# Patient Record
Sex: Female | Born: 1965 | Race: White | Hispanic: No | Marital: Married | Smoking: Never smoker
Health system: Southern US, Community
[De-identification: ages and names within clinical notes are randomized; demographics above are authoritative.]

## PROBLEM LIST (undated history)

## (undated) DIAGNOSIS — S52123A Displaced fracture of head of unspecified radius, initial encounter for closed fracture: Secondary | ICD-10-CM

---

## 2008-12-28 ENCOUNTER — Emergency Department (HOSPITAL_COMMUNITY): Admission: EM | Admit: 2008-12-28 | Discharge: 2008-12-28 | Payer: Self-pay | Admitting: Emergency Medicine

## 2012-07-08 ENCOUNTER — Encounter (HOSPITAL_COMMUNITY): Payer: Self-pay | Admitting: Nurse Practitioner

## 2012-07-08 ENCOUNTER — Emergency Department (HOSPITAL_COMMUNITY)
Admission: EM | Admit: 2012-07-08 | Discharge: 2012-07-08 | Disposition: A | Discharge status: Home / Self Care | Payer: BC Managed Care – PPO | Attending: Emergency Medicine | Admitting: Emergency Medicine

## 2012-07-08 DIAGNOSIS — S61219A Laceration without foreign body of unspecified finger without damage to nail, initial encounter: Secondary | ICD-10-CM

## 2012-07-08 DIAGNOSIS — W278XXA Contact with other nonpowered hand tool, initial encounter: Secondary | ICD-10-CM | POA: Insufficient documentation

## 2012-07-08 DIAGNOSIS — S61209A Unspecified open wound of unspecified finger without damage to nail, initial encounter: Secondary | ICD-10-CM | POA: Insufficient documentation

## 2012-07-08 DIAGNOSIS — Y939 Activity, unspecified: Secondary | ICD-10-CM | POA: Insufficient documentation

## 2012-07-08 DIAGNOSIS — Y929 Unspecified place or not applicable: Secondary | ICD-10-CM | POA: Insufficient documentation

## 2012-07-08 MED ORDER — CEPHALEXIN 500 MG PO CAPS: 500.0000 mg | ORAL_CAPSULE | Freq: Four times a day (QID) | ORAL | Status: DC

## 2012-07-08 MED ORDER — OXYCODONE-ACETAMINOPHEN 5-325 MG PO TABS: 1.0000 | ORAL_TABLET | ORAL | Status: DC | PRN

## 2012-07-08 NOTE — ED Notes (Signed)
Pt reports she was cutting a sweater and cut into her L index finger. Pt reports at the time blood "Squirted across the room" but there is no active bleeding now and clean dressing is in place. Reports minimal pain .

## 2012-07-08 NOTE — ED Provider Notes (Signed)
History   This chart was scribed for Lori Razor, MD by Sofie Rower, ED Scribe. The patient was seen in room TR07C/TR07C and the patient's care was started at 3:45PM.     CSN: [id number]  Arrival date & time 07/08/12  1359   First MD Initiated Contact with Patient 07/08/12 1545      Chief Complaint  Patient presents with  . Laceration    (Consider location/radiation/quality/duration/timing/severity/associated sxs/prior treatment) Patient is a 47 y.o. female presenting with skin laceration. The history is provided by the patient. No language interpreter was used.  Laceration  The incident occurred 1 to 2 hours ago. The laceration is located on the left hand. Size: 1.5 cm. The laceration mechanism was a a metal edge. The pain is moderate. The pain has been constant since onset. She reports no foreign bodies present. Her tetanus status is UTD.    Lori Flores is a 48 y.o. female , who presents to the Emergency Department complaining of sudden, moderate, laceration located at the left 2nd finger, onset today (07/09/11). The pt reports she was cutting a sweatshirt for her dog, where her hands suddenly slipped, cutting her left 2nd finger with a pair of scissors. The pt has applied a bandage dressing which provides moderate relief of pain and bleeding associated with the laceration. Modifying factors include certain movements and positions of the left 2nd finger which intensifies the pain associated with the laceration. The pt has not taken medications for pain PTA.  The pt denies numbness and tingling. The pt's last tetanus immunization was three years ago.   The pt does not smoke or drink alcohol.     History reviewed. No pertinent past medical history.  History reviewed. No pertinent past surgical history.  History reviewed. No pertinent family history.  History  Substance Use Topics  . Smoking status: Never Smoker   . Smokeless tobacco: Not on file  . Alcohol Use: No    OB  History    Grav Para Term Preterm Abortions TAB SAB Ect Mult Living                  Review of Systems  Skin: Positive for wound.  All other systems reviewed and are negative.    Allergies  Review of patient's allergies indicates no known allergies.  Home Medications  No current outpatient prescriptions on file.  BP 118/81  Pulse 105  Temp 98.2 F (36.8 C) (Oral)  Resp 16  SpO2 98%  Physical Exam  Nursing note and vitals reviewed. Constitutional: She appears well-developed and well-nourished. No distress.  HENT:  Head: Normocephalic and atraumatic.  Eyes: Conjunctivae normal are normal. Right eye exhibits no discharge. Left eye exhibits no discharge.  Neck: Neck supple.  Cardiovascular: Normal rate, regular rhythm and normal heart sounds.  Exam reveals no gallop and no friction rub.   No murmur heard. Pulmonary/Chest: Effort normal and breath sounds normal. No respiratory distress.  Abdominal: Soft. She exhibits no distension. There is no tenderness.  Musculoskeletal: She exhibits no edema and no tenderness.       1.5 cm laceration located at the flexor crease of the DIP of the left index finger. Mild oozing. Flexor tendon not visualized. Patient able to flex of the DIP against resistance. Decrease sensation at the ulnar aspect of the fingertip. Cap refill less than 2 seconds at fingertip.  Neurological: She is alert.  Skin: Skin is warm and dry.  Psychiatric: She has a normal mood and affect.  Her behavior is normal. Thought content normal.    ED Course  NERVE BLOCK Date/Time: 07/08/2012 5:17 PM Performed by: Lori Flores Authorized by: Lori Flores Consent: Verbal consent obtained. Risks and benefits: risks, benefits and alternatives were discussed Consent given by: patient Patient identity confirmed: verbally with patient Indications: pain relief Body area: upper extremity Nerve: digital Laterality: left Patient sedated: no Preparation: Patient was prepped  and draped in the usual sterile fashion. Patient position: sitting Needle gauge: 27 G Location technique: anatomical landmarks Local anesthetic: lidocaine 2% with epinephrine Anesthetic total: 2 ml Outcome: pain improved Patient tolerance: Patient tolerated the procedure well with no immediate complications.   (including critical care time)  LACERATION REPAIR Performed by: Lori Flores Authorized by: Lori Flores Consent: Verbal consent obtained. Risks and benefits: risks, benefits and alternatives were discussed Consent given by: patient Patient identity confirmed: provided demographic data Prepped and Draped in normal sterile fashion Wound explored  Laceration Location: Left index finger  Laceration Length: 1.5 cm  No Foreign Bodies seen or palpated  Anesthesia: local infiltration  Local anesthetic: lidocaine 2% w/ epinephrine  Anesthetic total: 2 ml  Irrigation method: syringe Amount of cleaning: standard  Skin closure: Single layer   Number of sutures: 3  Technique: Simple interrupted  Patient tolerance: Patient tolerated the procedure well with no immediate complications.   DIAGNOSTIC STUDIES: Oxygen Saturation is 98% on room air, normal by my interpretation.    COORDINATION OF CARE:  3:56 PM- Treatment plan discussed with patient. Pt agrees with treatment.  4:21 PM- Digital block performed.Treatment plan discussed with patient. Pt agrees with treatment.  5:09 PM- Recheck. Treatment plan concerning follow up with hand specialist on Monday, 07/10/12, discussed with patient. Pt agrees with treatment.          Labs Reviewed - No data to display No results found.   1. Finger laceration       MDM  46y female w/ finger lac. Decreased sensation concern for possible digital nerve injury. Laceration was closed. Case was discussed with on-call hand surgery, Dr. Amanda Pea.  He would like to see patient in followup on Monday. Requesting Keflex. Pain  medicine prescription provided. Emergent return precautions and continued wound care discussed.     I personally preformed the services scribed in my presence. The recorded information has been reviewed is accurate. Lori Razor, MD.    Lori Razor, MD 07/11/12 [phone]

## 2012-07-08 NOTE — ED Notes (Addendum)
Dr. Juleen China numbed pt. Laceration with xylocaine 2% and sutured. Pt tolerated well.

## 2016-06-28 ENCOUNTER — Encounter (HOSPITAL_COMMUNITY): Payer: Self-pay

## 2016-06-28 ENCOUNTER — Emergency Department (HOSPITAL_COMMUNITY)
Admission: EM | Admit: 2016-06-28 | Discharge: 2016-06-28 | Disposition: A | Discharge status: Home / Self Care | Payer: BLUE CROSS/BLUE SHIELD | Attending: Physician Assistant | Admitting: Physician Assistant

## 2016-06-28 ENCOUNTER — Emergency Department (HOSPITAL_COMMUNITY): Payer: BLUE CROSS/BLUE SHIELD

## 2016-06-28 DIAGNOSIS — S52125A Nondisplaced fracture of head of left radius, initial encounter for closed fracture: Secondary | ICD-10-CM | POA: Diagnosis not present

## 2016-06-28 DIAGNOSIS — Y9351 Activity, roller skating (inline) and skateboarding: Secondary | ICD-10-CM | POA: Insufficient documentation

## 2016-06-28 DIAGNOSIS — Y999 Unspecified external cause status: Secondary | ICD-10-CM | POA: Insufficient documentation

## 2016-06-28 DIAGNOSIS — S59902A Unspecified injury of left elbow, initial encounter: Secondary | ICD-10-CM | POA: Diagnosis present

## 2016-06-28 DIAGNOSIS — Y92480 Sidewalk as the place of occurrence of the external cause: Secondary | ICD-10-CM | POA: Diagnosis not present

## 2016-06-28 MED ORDER — TRAMADOL HCL 50 MG PO TABS: 50.0000 mg | ORAL_TABLET | Freq: Four times a day (QID) | ORAL | 0 refills | Status: DC | PRN

## 2016-06-28 MED ORDER — TRAMADOL HCL 50 MG PO TABS
50.0000 mg | ORAL_TABLET | Freq: Once | ORAL | Status: DC
  Filled 2016-06-28: qty 1

## 2016-06-28 NOTE — ED Notes (Signed)
Patient taken to xray.

## 2016-06-28 NOTE — ED Provider Notes (Signed)
[facility]-EMERGENCY DEPT Provider Note   CSN: [id number] Arrival date & time: 06/28/16  1759  By signing my name below, I, Clarisse GougeXavier Herndon, attest that this documentation has been prepared under the direction and in the presence of [facility] Toniqua Melamed, [place]. Electronically Signed: Clarisse GougeXavier Herndon, Scribe. 06/28/16. 7:12 PM.    History   Chief Complaint Chief Complaint  Patient presents with  . Arm Pain   The history is provided by the patient. No language interpreter was used.    HPI Comments: Lori Flores is a 50 y.o. female who presents to the Emergency Department complaining of left elbow pain after sustaining an injury 1 hour ago. She states she was on a "one wheel" skateboard in her neighborhood when she ran into the sidewalk. The pt states she then fell from the skateboard, and she hit her elbow on the pavement. She notes pain exacerbated when extending, rotating and lowering the left forearm. She reveals she can extend the forearm slightly if she raises the arm. She discloses she has taken nothing for pain at home. patient denies head injury or LOC.    History reviewed. No pertinent past medical history.  There are no active problems to display for this patient.   History reviewed. No pertinent surgical history.  OB History    No data available       Home Medications    Prior to Admission medications   Medication Sig Start Date End Date Taking? Authorizing Provider  amphetamine-dextroamphetamine (ADDERALL) 30 MG tablet Take 30 mg by mouth daily.    Historical Provider, MD  cephALEXin (KEFLEX) 500 MG capsule Take 1 capsule (500 mg total) by mouth 4 (four) times daily. 07/08/12   Raeford RazorStephen Kohut, MD  oxyCODONE-acetaminophen (PERCOCET/ROXICET) 5-325 MG per tablet Take 1-2 tablets by mouth every 4 (four) hours as needed for pain. 07/08/12   Raeford RazorStephen Kohut, MD  traMADol (ULTRAM) 50 MG tablet Take 1 tablet (50 mg total) by mouth every 6 (six) hours as needed. 06/28/16   Jamus Loving Orlene OchM Kylin Dubs, NP     Family History History reviewed. No pertinent family history.  Social History Social History  Substance Use Topics  . Smoking status: Never Smoker  . Smokeless tobacco: Never Used  . Alcohol use No     Allergies   Patient has no known allergies.   Review of Systems Review of Systems  Gastrointestinal: Negative for nausea and vomiting.  Musculoskeletal: Positive for arthralgias.       Left elbow pain  Skin: Negative for wound.       intact  Neurological: Negative for syncope and headaches.  All other systems reviewed and are negative.    Physical Exam Updated Vital Signs BP 114/67   Pulse 98   Temp 98.2 F (36.8 C) (Oral)   Resp 14   LMP 06/28/2016   SpO2 98%   Physical Exam  Constitutional: She is oriented to person, place, and time. She appears well-developed and well-nourished. No distress.  HENT:  Head: Normocephalic and atraumatic.  Right Ear: Hearing normal.  Left Ear: Hearing normal.  Mouth/Throat: Mucous membranes are normal.  Eyes: EOM are normal.  Neck: Normal range of motion. Neck supple.  Cardiovascular: Normal rate, S1 normal and S2 normal.   Pulmonary/Chest: Effort normal. No respiratory distress.  Abdominal: Normal appearance. There is no hepatosplenomegaly. There is no tenderness at McBurney's point and negative Murphy's sign.  Musculoskeletal: She exhibits tenderness. She exhibits no deformity.       Left elbow: She exhibits  swelling. She exhibits no deformity and no laceration. Decreased range of motion: due to pain. Tenderness found. Radial head tenderness noted.  Radial pulse 2+, adequate circulation.   Neurological: She is alert and oriented to person, place, and time. She has normal strength.  Skin: Skin is warm, dry and intact. No cyanosis.  intact  Psychiatric: She has a normal mood and affect. Her speech is normal and behavior is normal. Thought content normal.  Nursing note and vitals reviewed.    ED Treatments / Results   DIAGNOSTIC STUDIES: Oxygen Saturation is 98% on RA, normal by my interpretation.    COORDINATION OF CARE: 7:12 PM Discussed treatment plan with pt at bedside and pt agreed to plan.  Labs (all labs ordered are listed, but only abnormal results are displayed) Labs Reviewed - No data to display   Radiology Dg Elbow Complete Left  Result Date: 06/28/2016 CLINICAL DATA:  Fall from skateboard, left elbow pain EXAM: LEFT ELBOW - COMPLETE 3+ VIEW COMPARISON:  None. FINDINGS: Possible nondisplaced fracture of the radial head/neck. The joint spaces are preserved. Displaced elbow joint fat pads, suggesting elbow joint effusion. IMPRESSION: Possible nondisplaced fracture of the radial head/ neck. Correlate for point tenderness. Suspected elbow joint effusion. Electronically Signed   By: Charline BillsSriyesh  Krishnan M.D.   On: 06/28/2016 18:38   Dg Forearm Left  Result Date: 06/28/2016 CLINICAL DATA:  Fall from skateboard, left arm pain EXAM: LEFT FOREARM - 2 VIEW COMPARISON:  None. FINDINGS: Proximal forearm/elbow is evaluated separately. Distal radius/ulna are intact. The joint spaces are preserved. Visualized soft tissues are within normal limits. IMPRESSION: No fracture or dislocation of the distal radius/ulna. Electronically Signed   By: Charline BillsSriyesh  Krishnan M.D.   On: 06/28/2016 18:39    Procedures Procedures (including critical care time)  Medications Ordered in ED Medications  traMADol (ULTRAM) tablet 50 mg (not administered)     Initial Impression / Assessment and Plan / ED Course  I have reviewed the triage vital signs and the nursing notes.  Pertinent imaging results that were available during my care of the patient were reviewed by me and considered in my medical decision making (see chart for details).  Clinical Course   50 y.o. female with left elbow pain s/p fall stable for d/c without focal neuro deficits. X-ray positive for radial head fracture. Discussed with the patient x-ray results,  pain managed in the ED, arm sling applied, ice and f/u with ortho. Patient agrees with plan. Return precautions given.   Final Clinical Impressions(s) / ED Diagnoses   Final diagnoses:  Closed nondisplaced fracture of head of left radius, initial encounter    New Prescriptions New Prescriptions   TRAMADOL (ULTRAM) 50 MG TABLET    Take 1 tablet (50 mg total) by mouth every 6 (six) hours as needed.  *I personally performed the services described in this documentation, which was scribed in my presence. The recorded information has been reviewed and is accurate.    [place]port[place] M Kwali Wrinkle, NP 06/28/16 1921    Courteney Randall AnLyn Mackuen, MD 06/29/16 [phone]

## 2016-06-28 NOTE — ED Triage Notes (Signed)
Pt complaining of L arm pain. Pt states fell off of skateboard. Pt states fell onto left arm. Pt complaining of L posterior forearm pain at the elbow. Pt with full ROM, some decreased ability to extend arm. Pt denies any head injury/trauma. Pt denies any LOC.

## 2016-06-28 NOTE — Discharge Instructions (Signed)
Do not drive while taking the narcotic. Call tomorrow to schedule follow up. Return here as needed for any problems.

## 2016-08-28 ENCOUNTER — Emergency Department (HOSPITAL_COMMUNITY): Payer: BLUE CROSS/BLUE SHIELD

## 2016-08-28 ENCOUNTER — Encounter (HOSPITAL_COMMUNITY): Payer: Self-pay | Admitting: Emergency Medicine

## 2016-08-28 ENCOUNTER — Inpatient Hospital Stay (HOSPITAL_COMMUNITY)
Admission: EM | Admit: 2016-08-28 | Discharge: 2016-09-01 | DRG: 470 | Disposition: A | Discharge status: Home Health | Payer: BLUE CROSS/BLUE SHIELD | Attending: Internal Medicine | Admitting: Internal Medicine

## 2016-08-28 DIAGNOSIS — Y9351 Activity, roller skating (inline) and skateboarding: Secondary | ICD-10-CM

## 2016-08-28 DIAGNOSIS — S72009A Fracture of unspecified part of neck of unspecified femur, initial encounter for closed fracture: Secondary | ICD-10-CM | POA: Diagnosis present

## 2016-08-28 DIAGNOSIS — S72002A Fracture of unspecified part of neck of left femur, initial encounter for closed fracture: Principal | ICD-10-CM | POA: Diagnosis present

## 2016-08-28 DIAGNOSIS — D72829 Elevated white blood cell count, unspecified: Secondary | ICD-10-CM | POA: Diagnosis present

## 2016-08-28 DIAGNOSIS — Z419 Encounter for procedure for purposes other than remedying health state, unspecified: Secondary | ICD-10-CM

## 2016-08-28 DIAGNOSIS — E876 Hypokalemia: Secondary | ICD-10-CM | POA: Diagnosis present

## 2016-08-28 DIAGNOSIS — W19XXXA Unspecified fall, initial encounter: Secondary | ICD-10-CM

## 2016-08-28 DIAGNOSIS — Z79899 Other long term (current) drug therapy: Secondary | ICD-10-CM

## 2016-08-28 DIAGNOSIS — D62 Acute posthemorrhagic anemia: Secondary | ICD-10-CM | POA: Diagnosis not present

## 2016-08-28 DIAGNOSIS — M25552 Pain in left hip: Secondary | ICD-10-CM | POA: Diagnosis not present

## 2016-08-28 DIAGNOSIS — E559 Vitamin D deficiency, unspecified: Secondary | ICD-10-CM | POA: Diagnosis present

## 2016-08-28 HISTORY — DX: Displaced fracture of head of unspecified radius, initial encounter for closed fracture: S52.123A

## 2016-08-28 LAB — BASIC METABOLIC PANEL
Anion gap: 5 (ref 5–15)
BUN: 13 mg/dL (ref 6–20)
CHLORIDE: 111 mmol/L (ref 101–111)
CO2: 23 mmol/L (ref 22–32)
CREATININE: 0.58 mg/dL (ref 0.44–1.00)
Calcium: 8.8 mg/dL — ABNORMAL LOW (ref 8.9–10.3)
GFR calc non Af Amer: >60 mL/min (ref >60)
Glucose, Bld: 107 mg/dL — ABNORMAL HIGH (ref 65–99)
POTASSIUM: 3.9 mmol/L (ref 3.5–5.1)
SODIUM: 139 mmol/L (ref 135–145)

## 2016-08-28 LAB — PROTIME-INR
INR: 0.94
PROTHROMBIN TIME: 12.6 s (ref 11.4–15.2)

## 2016-08-28 LAB — CBC
HCT: 39.5 % (ref 36.0–46.0)
HEMOGLOBIN: 13.3 g/dL (ref 12.0–15.0)
MCH: 29 pg (ref 26.0–34.0)
MCHC: 33.7 g/dL (ref 30.0–36.0)
MCV: 86.2 fL (ref 78.0–100.0)
Platelets: 330 10*3/uL (ref 150–400)
RBC: 4.58 MIL/uL (ref 3.87–5.11)
RDW: 13 % (ref 11.5–15.5)
WBC: 13.4 10*3/uL — AB (ref 4.0–10.5)

## 2016-08-28 LAB — TYPE AND SCREEN
ABO/RH(D): O POS
ANTIBODY SCREEN: NEGATIVE

## 2016-08-28 MED ORDER — HYDROMORPHONE HCL 1 MG/ML IJ SOLN
1.0000 mg | Freq: Once | INTRAMUSCULAR | Status: AC
  Administered 2016-08-28: 1 mg via INTRAVENOUS
  Filled 2016-08-28: qty 1

## 2016-08-28 MED ORDER — MIDAZOLAM HCL 2 MG/2ML IJ SOLN
1.0000 mg | Freq: Once | INTRAMUSCULAR | Status: AC
  Administered 2016-08-28: 1 mg via INTRAVENOUS
  Filled 2016-08-28: qty 2

## 2016-08-28 MED ORDER — ONDANSETRON HCL 4 MG/2ML IJ SOLN: 4.0000 mg | Freq: Four times a day (QID) | INTRAMUSCULAR | Status: DC | PRN

## 2016-08-28 MED ORDER — MORPHINE SULFATE (PF) 4 MG/ML IV SOLN
4.0000 mg | Freq: Once | INTRAVENOUS | Status: AC
  Administered 2016-08-28: 4 mg via INTRAVENOUS
  Filled 2016-08-28: qty 1

## 2016-08-28 NOTE — ED Provider Notes (Signed)
[facility]-EMERGENCY DEPT Provider Note   CSN: [id number] Arrival date & time: 08/28/16  1948     History   Chief Complaint Chief Complaint  Patient presents with  . Fall    HPI Lori Flores is a 51 y.o. female.  HPI Pt presents with severe left hip pain after a full today from a motorized skateboard. Pain with ROM. No head or neck pain. No head injury. Denies weakness in arms or legs. No other complaints at this time.  Reports severe pain with range of motion of her left hip and does not want her left hip moved at this time.  No other complaints.   Past Medical History:  Diagnosis Date  . Radial head fracture     There are no active problems to display for this patient.   History reviewed. No pertinent surgical history.  OB History    No data available       Home Medications    Prior to Admission medications   Medication Sig Start Date End Date Taking? Authorizing Provider  amphetamine-dextroamphetamine (ADDERALL) 30 MG tablet Take 15-30 mg by mouth 2 (two) times daily. Take 30mg  by mouth at 12 noon and take 15 mg by mouth at 4pm.   Yes Historical Provider, MD    Family History No family history on file.  Social History Social History  Substance Use Topics  . Smoking status: Never Smoker  . Smokeless tobacco: Never Used  . Alcohol use No     Allergies   Patient has no known allergies.   Review of Systems Review of Systems  All other systems reviewed and are negative.    Physical Exam Updated Vital Signs BP 125/79   Pulse 100   Temp 98.1 F (36.7 C) (Oral)   Resp 20   Ht 5\' 5"  (1.651 m)   Wt 150 lb (68 kg)   LMP 08/21/2016   SpO2 100%   BMI 24.96 kg/m   Physical Exam  Constitutional: She is oriented to person, place, and time. She appears well-developed and well-nourished. No distress.  HENT:  Head: Normocephalic and atraumatic.  Eyes: EOM are normal.  Neck: Normal range of motion.  Cardiovascular: Normal rate, regular rhythm and  normal heart sounds.   Pulmonary/Chest: Effort normal and breath sounds normal.  Abdominal: Soft. She exhibits no distension. There is no tenderness.  Musculoskeletal:  Severe pain with range of motion of left hip.  No obvious deformity.  Normal pulses in left foot.  Neurological: She is alert and oriented to person, place, and time.  Skin: Skin is warm and dry.  Psychiatric: She has a normal mood and affect. Judgment normal.  Nursing note and vitals reviewed.    ED Treatments / Results  Labs (all labs ordered are listed, but only abnormal results are displayed) Labs Reviewed  CBC - Abnormal; Notable for the following:       Result Value   WBC 13.4 (*)    All other components within normal limits  BASIC METABOLIC PANEL - Abnormal; Notable for the following:    Glucose, Bld 107 (*)    Calcium 8.8 (*)    All other components within normal limits  PROTIME-INR  TYPE AND SCREEN  ABO/RH    EKG  EKG Interpretation None       Radiology Dg Chest Portable 1 View  Result Date: 08/28/2016 CLINICAL DATA:  Fall off skateboard EXAM: PORTABLE CHEST 1 VIEW COMPARISON:  None. FINDINGS: The heart size and mediastinal contours  are within normal limits. Both lungs are clear. The visualized skeletal structures are unremarkable. IMPRESSION: No active disease. Electronically Signed   By: Jasmine PangKim  Fujinaga M.D.   On: 08/28/2016 21:30   Dg Hip Unilat With Pelvis 2-3 Views Left  Result Date: 08/28/2016 CLINICAL DATA:  Fall off a skateboard EXAM: DG HIP (WITH OR WITHOUT PELVIS) 2-3V LEFT COMPARISON:  None. FINDINGS: The SI joints are symmetric bilaterally. Pubic symphysis and rami appear intact. The right femoral head projects in joint. The left femoral head projects in joint. There is step-off deformity at the left femoral head neck junction. IMPRESSION: 1. Mild step-off deformity at the left femoral head neck junction suspicious for a fracture. Suggest CT for further evaluation. 2. Right hip within  normal limits Electronically Signed   By: Jasmine PangKim  Fujinaga M.D.   On: 08/28/2016 21:30    Procedures Procedures (including critical care time)  Medications Ordered in ED Medications  ondansetron [facility](ZOFRAN) injection 4 mg (not administered)  HYDROmorphone (DILAUDID) injection 1 mg (1 mg Intravenous Given 08/28/16 2039)  midazolam (VERSED) injection 1 mg (1 mg Intravenous Given 08/28/16 2047)     Initial Impression / Assessment and Plan / ED Course  I have reviewed the triage vital signs and the nursing notes.  Pertinent labs & imaging results that were available during my care of the patient were reviewed by me and considered in my medical decision making (see chart for details).     Patient with evidence of a left hip fracture on CT imaging.  Preoperative labs and images obtained.  Working on pain control this time.  Nothing by mouth after midnight.  I [facility]  Patient be admitted to the hospitalist service.  Final Clinical Impressions(s) / ED Diagnoses   Final diagnoses:  Fall    New Prescriptions New Prescriptions   No medications on file     Azalia BilisKevin Emnet Monk, MD 08/28/16 2243

## 2016-08-28 NOTE — ED Triage Notes (Signed)
Pt brought from home via EMS for fall off skateboard.  Pt states she was riding and 'did a split and felt something pop in left hip'.  No obvious deformity in L hip, limited ROM & severe pain w/movement, full sensation & strength L foot, +2 pulses.  200 mcg Fentanyl & 20 G IV in LAC per EMS. VS per EMS: 136/74, 116 bpm, 20 RR

## 2016-08-29 ENCOUNTER — Encounter (HOSPITAL_COMMUNITY): Payer: Self-pay | Admitting: Internal Medicine

## 2016-08-29 DIAGNOSIS — E876 Hypokalemia: Secondary | ICD-10-CM | POA: Diagnosis present

## 2016-08-29 DIAGNOSIS — M25552 Pain in left hip: Secondary | ICD-10-CM | POA: Diagnosis present

## 2016-08-29 DIAGNOSIS — D649 Anemia, unspecified: Secondary | ICD-10-CM | POA: Diagnosis not present

## 2016-08-29 DIAGNOSIS — S72002A Fracture of unspecified part of neck of left femur, initial encounter for closed fracture: Secondary | ICD-10-CM | POA: Diagnosis present

## 2016-08-29 DIAGNOSIS — Z79899 Other long term (current) drug therapy: Secondary | ICD-10-CM | POA: Diagnosis not present

## 2016-08-29 DIAGNOSIS — D72825 Bandemia: Secondary | ICD-10-CM | POA: Diagnosis not present

## 2016-08-29 DIAGNOSIS — D72829 Elevated white blood cell count, unspecified: Secondary | ICD-10-CM | POA: Diagnosis present

## 2016-08-29 DIAGNOSIS — S72009A Fracture of unspecified part of neck of unspecified femur, initial encounter for closed fracture: Secondary | ICD-10-CM | POA: Diagnosis present

## 2016-08-29 DIAGNOSIS — E559 Vitamin D deficiency, unspecified: Secondary | ICD-10-CM | POA: Diagnosis present

## 2016-08-29 DIAGNOSIS — Y9351 Activity, roller skating (inline) and skateboarding: Secondary | ICD-10-CM | POA: Diagnosis not present

## 2016-08-29 DIAGNOSIS — S72002D Fracture of unspecified part of neck of left femur, subsequent encounter for closed fracture with routine healing: Secondary | ICD-10-CM | POA: Diagnosis not present

## 2016-08-29 DIAGNOSIS — D62 Acute posthemorrhagic anemia: Secondary | ICD-10-CM | POA: Diagnosis not present

## 2016-08-29 LAB — BASIC METABOLIC PANEL
Anion gap: 10 (ref 5–15)
BUN: 8 mg/dL (ref 6–20)
CALCIUM: 8.6 mg/dL — AB (ref 8.9–10.3)
CO2: 24 mmol/L (ref 22–32)
CREATININE: 0.59 mg/dL (ref 0.44–1.00)
Chloride: 104 mmol/L (ref 101–111)
GFR calc non Af Amer: >60 mL/min (ref >60)
GLUCOSE: 102 mg/dL — AB (ref 65–99)
Potassium: 3.3 mmol/L — ABNORMAL LOW (ref 3.5–5.1)
Sodium: 138 mmol/L (ref 135–145)

## 2016-08-29 LAB — TYPE AND SCREEN
ABO/RH(D): O POS
Antibody Screen: NEGATIVE

## 2016-08-29 LAB — CBC
HEMATOCRIT: 37.4 % (ref 36.0–46.0)
Hemoglobin: 12.3 g/dL (ref 12.0–15.0)
MCH: 28.6 pg (ref 26.0–34.0)
MCHC: 32.9 g/dL (ref 30.0–36.0)
MCV: 87 fL (ref 78.0–100.0)
Platelets: 293 10*3/uL (ref 150–400)
RBC: 4.3 MIL/uL (ref 3.87–5.11)
RDW: 12.9 % (ref 11.5–15.5)
WBC: 11.1 10*3/uL — ABNORMAL HIGH (ref 4.0–10.5)

## 2016-08-29 LAB — ALBUMIN: Albumin: 3.2 g/dL — ABNORMAL LOW (ref 3.5–5.0)

## 2016-08-29 LAB — ABO/RH
ABO/RH(D): O POS
ABO/RH(D): O POS

## 2016-08-29 LAB — SURGICAL PCR SCREEN
MRSA, PCR: NEGATIVE
STAPHYLOCOCCUS AUREUS: NEGATIVE

## 2016-08-29 MED ORDER — TRANEXAMIC ACID 1000 MG/10ML IV SOLN: 2000.0000 mg | Freq: Once | INTRAVENOUS | Status: DC

## 2016-08-29 MED ORDER — SODIUM CHLORIDE 0.9 % IV SOLN
INTRAVENOUS | Status: AC
  Administered 2016-08-29: 06:00:00 via INTRAVENOUS

## 2016-08-29 MED ORDER — HYDROMORPHONE HCL 2 MG/ML IJ SOLN
1.0000 mg | INTRAMUSCULAR | Status: DC | PRN
  Administered 2016-08-29 – 2016-08-30 (×8): 1 mg via INTRAVENOUS
  Filled 2016-08-29 (×9): qty 1

## 2016-08-29 MED ORDER — POVIDONE-IODINE 10 % EX SWAB: 2.0000 "application " | Freq: Once | CUTANEOUS | Status: DC

## 2016-08-29 MED ORDER — SODIUM CHLORIDE 0.9 % IV SOLN
30.0000 meq | Freq: Once | INTRAVENOUS | Status: AC
  Administered 2016-08-29: 30 meq via INTRAVENOUS
  Filled 2016-08-29: qty 15

## 2016-08-29 MED ORDER — METHOCARBAMOL 500 MG PO TABS
500.0000 mg | ORAL_TABLET | Freq: Four times a day (QID) | ORAL | Status: DC | PRN
  Administered 2016-08-29 – 2016-08-30 (×3): 500 mg via ORAL
  Filled 2016-08-29 (×3): qty 1

## 2016-08-29 MED ORDER — BUPIVACAINE LIPOSOME 1.3 % IJ SUSP: 20.0000 mL | Freq: Once | INTRAMUSCULAR | Status: DC

## 2016-08-29 MED ORDER — HYDROMORPHONE HCL 2 MG/ML IJ SOLN
1.0000 mg | Freq: Once | INTRAMUSCULAR | Status: AC
  Administered 2016-08-29: 1 mg via INTRAVENOUS
  Filled 2016-08-29: qty 1

## 2016-08-29 MED ORDER — METHOCARBAMOL 1000 MG/10ML IJ SOLN: 500.0000 mg | Freq: Four times a day (QID) | INTRAVENOUS | Status: DC | PRN

## 2016-08-29 MED ORDER — MORPHINE SULFATE (PF) 2 MG/ML IV SOLN
0.5000 mg | INTRAVENOUS | Status: DC | PRN
  Administered 2016-08-29: 0.5 mg via INTRAVENOUS
  Filled 2016-08-29: qty 1

## 2016-08-29 MED ORDER — MORPHINE SULFATE (PF) 4 MG/ML IV SOLN
4.0000 mg | Freq: Once | INTRAVENOUS | Status: AC
  Administered 2016-08-29: 4 mg via INTRAVENOUS
  Filled 2016-08-29: qty 1

## 2016-08-29 MED ORDER — CHLORHEXIDINE GLUCONATE 4 % EX LIQD
60.0000 mL | Freq: Once | CUTANEOUS | Status: AC
  Administered 2016-08-30: 4 via TOPICAL
  Filled 2016-08-29: qty 60

## 2016-08-29 MED ORDER — ENOXAPARIN SODIUM 40 MG/0.4ML ~~LOC~~ SOLN
40.0000 mg | Freq: Once | SUBCUTANEOUS | Status: AC
  Administered 2016-08-29: 40 mg via SUBCUTANEOUS
  Filled 2016-08-29: qty 0.4

## 2016-08-29 NOTE — Progress Notes (Signed)
[facility] PROGRESS NOTE  Lori Flores H Heaps VOZ:[id number] DOB: [birth year] DOA: 08/28/2016  PCP: Default, Provider, MD  Brief History/Interval Summary: 51 year old Caucasian female with no significant past medical history who had a radial head fracture recently, which was treated with a splint and has fully recovered. She was on a motorized skateboard yesterday and she fell off of it. She heard a popping sound in her left hip and was found to have a fracture. Hospitalized for further management.  Reason for Visit: Left hip fracture  Consultants: Orthopedics  Procedures: None yet  Antibiotics: None  Subjective/Interval History: Patient was complaining of pain this morning, 9 out of 10 in intensity. She was given Dilaudid with improvement. She denies any chest pain, shortness of breath, nausea, vomiting.  ROS: Denies headaches.  Objective:  Vital Signs  Vitals:   08/29/16 0330 08/29/16 0345 08/29/16 0400 08/29/16 0500  BP: 113/59  131/87 121/69  Pulse: 80 82 85 (!) 111  Resp: 19 21 13    Temp:    98.4 F (36.9 C)  TempSrc:    Oral  SpO2: 100% 100% 100% 98%  Weight:      Height:       No intake or output data in the 24 hours ending 08/29/16 1022 Filed Weights   08/28/16 2002  Weight: 68 kg (150 lb)    General appearance: alert, cooperative, appears stated age and no distress Resp: clear to auscultation bilaterally Cardio: regular rate and rhythm, S1, S2 normal, no murmur, click, rub or gallop GI: soft, non-tender; bowel sounds normal; no masses,  no organomegaly Extremities: Left lower extremity is externally rotated. No edema. Neurologic: No focal deficits  Lab Results:  Data Reviewed: I have personally reviewed following labs and imaging studies  CBC:  Recent Labs Lab 08/28/16 2110 08/29/16 0618  WBC 13.4* 11.1*  HGB 13.3 12.3  HCT 39.5 37.4  MCV 86.2 87.0  PLT 330 293    Basic Metabolic Panel:  Recent Labs Lab 08/28/16 2110 08/29/16 0618    NA 139 138  K 3.9 3.3*  CL 111 104  CO2 23 24  GLUCOSE 107* 102*  BUN 13 8  CREATININE 0.58 0.59  CALCIUM 8.8* 8.6*    GFR: Estimated Creatinine Clearance (by C-G formula based on SCr of 0.59 mg/dL) Female: 42.575.7 mL/min Female: 96.1 mL/min  Liver Function Tests:  Recent Labs Lab 08/29/16 0618  ALBUMIN 3.2*    Coagulation Profile:  Recent Labs Lab 08/28/16 2110  INR 0.94    Radiology Studies: Ct Hip Left Wo Contrast  Result Date: 08/28/2016 CLINICAL DATA:  Pt brought from home via EMS for fall off skateboard. Pt states she was riding and 'did a split and felt something pop in left hip'. No obvious deformity in L hip, limited ROM & severe pain w/movement, full sensation & strength L foot, +2 pulses. EXAM: CT OF THE LEFT HIP WITHOUT CONTRAST TECHNIQUE: Multidetector CT imaging of the left hip was performed according to the standard protocol. Multiplanar CT image reconstructions were also generated. COMPARISON:  None. FINDINGS: There is mildly comminuted and displaced fracture of the left femoral neck. This extends from the superior femoral head neck junction to the inferior mid cervical region. The fracture is mildly displaced, by 9 mm, with the distal fracture component retracting superiorly. No significant fracture angulation. The left hip joint is normally spaced and aligned. There are no other fractures. Mild edema is seen in the directly adjacent left hip deep soft tissues. There is  a small hemarthrosis. Visible structures within the pelvis are unremarkable. Surrounding muscular structures are unremarkable. The tendons are intact. IMPRESSION: 1. Mildly comminuted and displaced fracture of the left femoral neck as described. Electronically Signed   By: Amie Portland M.D.   On: 08/28/2016 23:03   Dg Chest Portable 1 View  Result Date: 08/28/2016 CLINICAL DATA:  Fall off skateboard EXAM: PORTABLE CHEST 1 VIEW COMPARISON:  None. FINDINGS: The heart size and mediastinal contours are  within normal limits. Both lungs are clear. The visualized skeletal structures are unremarkable. IMPRESSION: No active disease. Electronically Signed   By: Jasmine Pang M.D.   On: 08/28/2016 21:30   Dg Hip Unilat With Pelvis 2-3 Views Left  Result Date: 08/28/2016 CLINICAL DATA:  Fall off a skateboard EXAM: DG HIP (WITH OR WITHOUT PELVIS) 2-3V LEFT COMPARISON:  None. FINDINGS: The SI joints are symmetric bilaterally. Pubic symphysis and rami appear intact. The right femoral head projects in joint. The left femoral head projects in joint. There is step-off deformity at the left femoral head neck junction. IMPRESSION: 1. Mild step-off deformity at the left femoral head neck junction suspicious for a fracture. Suggest CT for further evaluation. 2. Right hip within normal limits Electronically Signed   By: Jasmine Pang M.D.   On: 08/28/2016 21:30     Medications:  Scheduled: . potassium chloride (KCL MULTIRUN) 30 mEq in 265 mL IVPB  30 mEq Intravenous Once   Continuous: . sodium chloride 75 mL/hr at 08/29/16 0554   ZOX:[id number] (DILAUDID) injection, methocarbamol **OR** methocarbamol (ROBAXIN)  IV, ondansetron (ZOFRAN) IV  Assessment/Plan:  Active Problems:   Closed left hip fracture (HCC)   Leukocytosis   Hip fracture (HCC)    Left hip fracture as a result of mechanical fall Orthopedics has been consulted. Management deferred to them. Can proceed to surgery without any cardiac testing. Morphine was not effective in relieving pain. Will change to Dilaudid.  Leukocytosis Patient is afebrile. Likely reactive due to acute stress. Improved this morning.  Hypokalemia Will be repleted.  DVT Prophylaxis: Lovenox    Code Status: Full code  Family Communication: Discussed with the patient  Disposition Plan: Management per orthopedics.    LOS: 0 days   [facility]  [facility] Pager [phone] 08/29/2016, 10:22 AM  If 7PM-7AM, please contact night-coverage at  [email or web address], password [facility]

## 2016-08-29 NOTE — ED Notes (Signed)
[facility] notified for need of [facility]

## 2016-08-29 NOTE — ED Notes (Signed)
Pt states that she can't move without pain and can't pee.  Will let staff know if she feels comfortable enough to go.

## 2016-08-29 NOTE — Consult Note (Addendum)
Reason for Consult: Displaced left femoral neck fracture vertical comminuted Referring Physician: Dr. Jacklyn Shell is an 51 y.o. female.  HPI: Was operating a motorized 1 we'll skateboard, attempting to get off skateboard and essentially did a full splits with a loud pop in her left hip as she landed on her backside. Immediate pain and inability to walk. Was transported to [facility] where plain x-rays and CT scan revealed displaced vertical fracture of the left femoral neck with posterior comminution. Patient sustained a nondisplaced left radial head fracture using the same skateboard on /25/17. She denies any loss of consciousness, or any other injuries. 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 She does not work outside the home. She denies any history of heart attack stroke diabetes or problems with anesthesia in the past. Patient reports that IV Dilaudid has controlled her pain.  Past Medical History:  Diagnosis Date  . Radial head fracture     History reviewed. No pertinent surgical history.  Family History  Problem Relation Age of Onset  . Hypertension Neg Hx   . Diabetes Neg Hx   . CAD Neg Hx     Social History:  reports that she has never smoked. She has never used smokeless tobacco. She reports that she does not drink alcohol or use drugs.  Allergies: No Known Allergies  Medications: I have reviewed the patient's current medications.  Results for orders placed or performed during the hospital encounter of 08/28/16 (from the past 48 hour(s))  CBC     Status: Abnormal   Collection Time: 08/28/16  9:10 PM  Result Value Ref Range   WBC 13.4 (H) 4.0 - 10.5 K/uL   RBC 4.58 3.87 - 5.11 MIL/uL   Hemoglobin 13.3 12.0 - 15.0 g/dL   HCT 39.5 36.0 - 46.0 %   MCV 86.2 78.0 - 100.0 fL   MCH 29.0 26.0 - 34.0 pg   MCHC 33.7 30.0 - 36.0 g/dL   RDW 13.0 11.5 - 15.5 %   Platelets 330 150  - 400 K/uL  Basic metabolic panel     Status: Abnormal   Collection Time: 08/28/16  9:10 PM  Result Value Ref Range   Sodium 139 135 - 145 mmol/L   Potassium 3.9 3.5 - 5.1 mmol/L   Chloride 111 101 - 111 mmol/L   CO2 23 22 - 32 mmol/L   Glucose, Bld 107 (H) 65 - 99 mg/dL   BUN 13 6 - 20 mg/dL   Creatinine, Ser 0.58 0.44 - 1.00 mg/dL   Calcium 8.8 (L) 8.9 - 10.3 mg/dL   GFR calc non Af Amer >60 >60 mL/min   GFR calc Af Amer >60 >60 mL/min    Comment: (NOTE) The eGFR has been calculated using the CKD EPI equation. This calculation has not been validated in all clinical situations. eGFR's persistently <60 mL/min signify possible Chronic Kidney Disease.    Anion gap 5 5 - 15  Protime-INR     Status: None   Collection Time: 08/28/16  9:10 PM  Result Value Ref Range   Prothrombin Time 12.6 11.4 - 15.2 seconds   INR 0.94   Type and screen [place]     Status: None   Collection Time: 08/28/16  9:10 PM  Result Value Ref Range   ABO/RH(D) O POS    Antibody Screen NEG  Sample Expiration 08/31/2016   ABO/Rh     Status: None   Collection Time: 08/28/16  9:10 PM  Result Value Ref Range   ABO/RH(D) O POS   CBC     Status: Abnormal   Collection Time: 08/29/16  6:18 AM  Result Value Ref Range   WBC 11.1 (H) 4.0 - 10.5 K/uL   RBC 4.30 3.87 - 5.11 MIL/uL   Hemoglobin 12.3 12.0 - 15.0 g/dL   HCT 37.4 36.0 - 46.0 %   MCV 87.0 78.0 - 100.0 fL   MCH 28.6 26.0 - 34.0 pg   MCHC 32.9 30.0 - 36.0 g/dL   RDW 12.9 11.5 - 15.5 %   Platelets 293 150 - 400 K/uL  Basic metabolic panel     Status: Abnormal   Collection Time: 08/29/16  6:18 AM  Result Value Ref Range   Sodium 138 135 - 145 mmol/L   Potassium 3.3 (L) 3.5 - 5.1 mmol/L   Chloride 104 101 - 111 mmol/L   CO2 24 22 - 32 mmol/L   Glucose, Bld 102 (H) 65 - 99 mg/dL   BUN 8 6 - 20 mg/dL   Creatinine, Ser 0.59 0.44 - 1.00 mg/dL   Calcium 8.6 (L) 8.9 - 10.3 mg/dL   GFR calc non Af Amer >60 >60 mL/min   GFR calc  Af Amer >60 >60 mL/min    Comment: (NOTE) The eGFR has been calculated using the CKD EPI equation. This calculation has not been validated in all clinical situations. eGFR's persistently <60 mL/min signify possible Chronic Kidney Disease.    Anion gap 10 5 - 15  Albumin     Status: Abnormal   Collection Time: 08/29/16  6:18 AM  Result Value Ref Range   Albumin 3.2 (L) 3.5 - 5.0 g/dL    Ct Hip Left Wo Contrast  Result Date: 08/28/2016 CLINICAL DATA:  Pt brought from home via EMS for fall off skateboard. Pt states she was riding and 'did a split and felt something pop in left hip'. No obvious deformity in L hip, limited ROM & severe pain w/movement, full sensation & strength L foot, +2 pulses. EXAM: CT OF THE LEFT HIP WITHOUT CONTRAST TECHNIQUE: Multidetector CT imaging of the left hip was performed according to the standard protocol. Multiplanar CT image reconstructions were also generated. COMPARISON:  None. FINDINGS: There is mildly comminuted and displaced fracture of the left femoral neck. This extends from the superior femoral head neck junction to the inferior mid cervical region. The fracture is mildly displaced, by 9 mm, with the distal fracture component retracting superiorly. No significant fracture angulation. The left hip joint is normally spaced and aligned. There are no other fractures. Mild edema is seen in the directly adjacent left hip deep soft tissues. There is a small hemarthrosis. Visible structures within the pelvis are unremarkable. Surrounding muscular structures are unremarkable. The tendons are intact. IMPRESSION: 1. Mildly comminuted and displaced fracture of the left femoral neck as described. Electronically Signed   By: Lajean Manes M.D.   On: 08/28/2016 23:03   Dg Chest Portable 1 View  Result Date: 08/28/2016 CLINICAL DATA:  Fall off skateboard EXAM: PORTABLE CHEST 1 VIEW COMPARISON:  None. FINDINGS: The heart size and mediastinal contours are within normal limits.  Both lungs are clear. The visualized skeletal structures are unremarkable. IMPRESSION: No active disease. Electronically Signed   By: Donavan Foil M.D.   On: 08/28/2016 21:30   Dg Hip Unilat With Pelvis 2-3  Views Left  Result Date: 08/28/2016 CLINICAL DATA:  Fall off a skateboard EXAM: DG HIP (WITH OR WITHOUT PELVIS) 2-3V LEFT COMPARISON:  None. FINDINGS: The SI joints are symmetric bilaterally. Pubic symphysis and rami appear intact. The right femoral head projects in joint. The left femoral head projects in joint. There is step-off deformity at the left femoral head neck junction. IMPRESSION: 1. Mild step-off deformity at the left femoral head neck junction suspicious for a fracture. Suggest CT for further evaluation. 2. Right hip within normal limits Electronically Signed   By: Donavan Foil M.D.   On: 08/28/2016 21:30    ROS: Patient denies any chest pain or shortness of breath. Blood pressure 121/69, pulse (!) 111, temperature 98.4 F (36.9 C), temperature source Oral, resp. rate 13, height '5\' 5"'  (1.651 m), weight 68 kg (150 lb), last menstrual period 08/21/2016, SpO2 98 %. Physical Exam: Tender to palpation over the left groin and lateral aspect of the hip. Hip is held in 10 of external rotation and there is severe pain with any attempts at motion. Toes are pink and well perfused skin is intact and she is neurovascular intact. There are no cuts scrapes or abrasions to the skin.  Assessment:  Vertical displaced comminuted left femoral neck fracture in a healthy 51 year old woman.  Plan:  After discussing options and obtaining informed consent from both the patient and her husband will proceed with anterior left total hip arthroplasty which will allow her to be full weightbearing immediately, diminish her pain and answer rehabilitation, with anticipated durability of 30 years for the implants. We discussed the risks and benefits of the surgery, to include infection loosening of the implants  fracture blood clots pulmonary emboli and anesthesia problems. Implants were called up on the computer we went over the procedure itself and how the bone would grow into the implants for long-term.Kerin Salen 08/29/2016, 2:07 PM

## 2016-08-29 NOTE — Progress Notes (Signed)
Patient was educated on the need for foley placement.  Patient stated "you tell the doctor if he want me to have a foley, he will have to knock me out first.  They can do it in the operating room".

## 2016-08-29 NOTE — H&P (Signed)
Lori Killianngle H Cutting ZOX:[id number] DOB: 08-02-1965 DOA: 08/28/2016     PCP: Default, Provider, MD   Outpatient Specialists:[facility]   Patient coming from:    home Lives With family    Chief Complaint: fall off a skateboard  HPI: Lori Flores is a 51 y.o. female with medical history significant of recent fracture of head of Left radius    Presented with fall after using motorized skate board she has fallen off after she did a split and felt something pop in left hip no obvious deformity was noted EMS was called because patient was having severe pain with movement. Denies any head injury no LOC otherwise no other complaints patient is otherwise healthy denies history of coronary artery disease hypertension or diabetes. Denies any recent chest pain or shortness of breath able to ambulate and walk up the stairs this out shortness of breath or chest pain.  of note patient have had same presentation on [facility] when she sustained left Radial head fracture  [facility]   IN ER:  Temp (24hrs), Avg:98.1 F (36.7 C), Min:98.1 F (36.7 C), Max:98.1 F (36.7 C)   RR 18 satting 98% HR 87 BP 119/83 WBC 13.4 hemoglobin 13.3 sodium 139  CT left hip showing mildly comminuted and displaced fracture of left femoral neck Following Medications were ordered in ER: Medications  ondansetron (ZOFRAN) injection 4 mg (not administered)  HYDROmorphone (DILAUDID) injection 1 mg (1 mg Intravenous Given 08/28/16 2039)  midazolam (VERSED) injection 1 mg (1 mg Intravenous Given 08/28/16 2047)  morphine 4 MG/ML injection 4 mg (4 mg Intravenous Given 08/28/16 2304)     ER provider discussed case with:  Orthopedics who wishes for patient to be [facility] to [facility] possible operative intervention   Hospitalist was called for admission for left hip fracture  Review of Systems:    Pertinent positives include: fall left hip pain  Constitutional:    No weight loss, night sweats, Fevers, chills, fatigue, weight loss  HEENT:  No headaches, Difficulty swallowing,Tooth/dental problems,Sore throat,  No sneezing, itching, ear ache, nasal congestion, post nasal drip,  Cardio-vascular:  No chest pain, Orthopnea, PND, anasarca, dizziness, palpitations.no Bilateral lower extremity swelling  GI:  No heartburn, indigestion, abdominal pain, nausea, vomiting, diarrhea, change in bowel habits, loss of appetite, melena, blood in stool, hematemesis Resp:  no shortness of breath at rest. No dyspnea on exertion, No excess mucus, no productive cough, No non-productive cough, No coughing up of blood.No change in color of mucus.No wheezing. Skin:  no rash or lesions. No jaundice GU:  no dysuria, change in color of urine, no urgency or frequency. No straining to urinate.  No flank pain.  Musculoskeletal:  No joint pain or no joint swelling. No decreased range of motion. No back pain.  Psych:  No change in mood or affect. No depression or anxiety. No memory loss.  Neuro: no localizing neurological complaints, no tingling, no weakness, no double vision, no gait abnormality, no slurred speech, no confusion  As per HPI otherwise 10 point review of systems negative.   Past Medical History: Past Medical History:  Diagnosis Date  . Radial head fracture    History reviewed. No pertinent surgical history.   Social History:  Ambulatory   independently  d    reports that she has never smoked. She has never used smokeless tobacco. She reports that she does not drink alcohol or use drugs.  Allergies:  No Known Allergies     Family History:   Family History  Problem Relation Age of Onset  . Hypertension Neg Hx   . Diabetes Neg Hx   . CAD Neg Hx     Medications: Prior to Admission medications   Medication Sig Start Date End Date Taking? Authorizing Provider  amphetamine-dextroamphetamine (ADDERALL) 30 MG tablet Take 15-30 mg by mouth 2 (two)  times daily. Take 30mg  by mouth at 12 noon and take 15 mg by mouth at 4pm.   Yes Historical Provider, MD    Physical Exam: Patient Vitals for the past 24 hrs:  BP Temp Temp src Pulse Resp SpO2 Height Weight  08/28/16 2300 119/83 - - 87 18 98 % - -  08/28/16 2245 - - - 96 - 99 % - -  08/28/16 2230 (!) 128/109 - - 98 - 99 % - -  08/28/16 2215 - - - 96 - 94 % - -  08/28/16 2200 126/85 - - 100 - 97 % - -  08/28/16 2106 125/79 - - 100 - 100 % - -  08/28/16 2002 - - - - - - 5\' 5"  (1.651 m) 68 kg (150 lb)  08/28/16 2001 112/72 98.1 F (36.7 C) Oral 98 20 100 % - -    1. General:  in No Acute distress 2. Psychological: Alert and  Oriented 3. Head/ENT:    Dry Mucous Membranes                          Head Non traumatic, neck supple                          Normal Dentition 4. SKIN: normal  Skin turgor,  Skin clean Dry and intact no rash 5. Heart: Regular rate and rhythm no  Murmur, Rub or gallop 6. Lungs:  Clear to auscultation bilaterally, no wheezes or crackles   7. Abdomen: Soft,  non-tender, Non distended 8. Lower extremities: no clubbing, cyanosis, or edema 9. Neurologically Grossly intact, moving all 3 extremities equally Limited left leg secondary to pain 10. MSK: Normal range of motion   body mass index is 24.96 kg/m.  Labs on Admission:   Labs on Admission: I have personally reviewed following labs and imaging studies  CBC:  Recent Labs Lab 08/28/16 2110  WBC 13.4*  HGB 13.3  HCT 39.5  MCV 86.2  PLT 330   Basic Metabolic Panel:  Recent Labs Lab 08/28/16 2110  NA 139  K 3.9  CL 111  CO2 23  GLUCOSE 107*  BUN 13  CREATININE 0.58  CALCIUM 8.8*   GFR: Estimated Creatinine Clearance (by C-G formula based on SCr of 0.58 mg/dL) Female: 95.6 mL/min Female: 96.1 mL/min Liver Function Tests: No results for input(s): AST, ALT, ALKPHOS, BILITOT, PROT, ALBUMIN in the last 168 hours. No results for input(s): LIPASE, AMYLASE in the last 168 hours. No results for  input(s): AMMONIA in the last 168 hours. Coagulation Profile:  Recent Labs Lab 08/28/16 2110  INR 0.94   Cardiac Enzymes: No results for input(s): CKTOTAL, CKMB, CKMBINDEX, TROPONINI in the last 168 hours. BNP (last 3 results) No results for input(s): PROBNP in the last 8760 hours. HbA1C: No results for input(s): HGBA1C in the last 72 hours. CBG: No results for input(s): GLUCAP in the last 168 hours. Lipid Profile: No results for input(s): CHOL, HDL, LDLCALC, TRIG, CHOLHDL, LDLDIRECT in  the last 72 hours. Thyroid Function Tests: No results for input(s): TSH, T4TOTAL, FREET4, T3FREE, THYROIDAB in the last 72 hours. Anemia Panel: No results for input(s): VITAMINB12, FOLATE, FERRITIN, TIBC, IRON, RETICCTPCT in the last 72 hours. Urine analysis: No results found for: COLORURINE, APPEARANCEUR, LABSPEC, PHURINE, GLUCOSEU, HGBUR, BILIRUBINUR, KETONESUR, PROTEINUR, UROBILINOGEN, NITRITE, LEUKOCYTESUR Sepsis Labs: @LABRCNTIP (procalcitonin:4,lacticidven:4) )No results found for this or any previous visit (from the past 240 hour(s)).    UA  ordered  No results found for: HGBA1C  Estimated Creatinine Clearance (by C-G formula based on SCr of 0.58 mg/dL) Female: 16.1 mL/min Female: 96.1 mL/min  BNP (last 3 results) No results for input(s): PROBNP in the last 8760 hours.   ECG REPORT ordered  [facility] Weights   08/28/16 2002  Weight: 68 kg (150 lb)     Cultures: No results found for: SDES, SPECREQUEST, CULT, REPTSTATUS   Radiological Exams on Admission: Ct Hip Left Wo Contrast  Result Date: 08/28/2016 CLINICAL DATA:  Pt brought from home via EMS for fall off skateboard. Pt states she was riding and 'did a split and felt something pop in left hip'. No obvious deformity in L hip, limited ROM & severe pain w/movement, full sensation & strength L foot, +2 pulses. EXAM: CT OF THE LEFT HIP WITHOUT CONTRAST TECHNIQUE: Multidetector CT imaging of the left hip was performed according to the  standard protocol. Multiplanar CT image reconstructions were also generated. COMPARISON:  None. FINDINGS: There is mildly comminuted and displaced fracture of the left femoral neck. This extends from the superior femoral head neck junction to the inferior mid cervical region. The fracture is mildly displaced, by 9 mm, with the distal fracture component retracting superiorly. No significant fracture angulation. The left hip joint is normally spaced and aligned. There are no other fractures. Mild edema is seen in the directly adjacent left hip deep soft tissues. There is a small hemarthrosis. Visible structures within the pelvis are unremarkable. Surrounding muscular structures are unremarkable. The tendons are intact. IMPRESSION: 1. Mildly comminuted and displaced fracture of the left femoral neck as described. Electronically Signed   By: Amie Portland M.D.   On: 08/28/2016 23:03   Dg Chest Portable 1 View  Result Date: 08/28/2016 CLINICAL DATA:  Fall off skateboard EXAM: PORTABLE CHEST 1 VIEW COMPARISON:  None. FINDINGS: The heart size and mediastinal contours are within normal limits. Both lungs are clear. The visualized skeletal structures are unremarkable. IMPRESSION: No active disease. Electronically Signed   By: Jasmine Pang M.D.   On: 08/28/2016 21:30   Dg Hip Unilat With Pelvis 2-3 Views Left  Result Date: 08/28/2016 CLINICAL DATA:  Fall off a skateboard EXAM: DG HIP (WITH OR WITHOUT PELVIS) 2-3V LEFT COMPARISON:  None. FINDINGS: The SI joints are symmetric bilaterally. Pubic symphysis and rami appear intact. The right femoral head projects in joint. The left femoral head projects in joint. There is step-off deformity at the left femoral head neck junction. IMPRESSION: 1. Mild step-off deformity at the left femoral head neck junction suspicious for a fracture. Suggest CT for further evaluation. 2. Right hip within normal limits Electronically Signed   By: Jasmine Pang M.D.   On: 08/28/2016 21:30     Chart has been reviewed    Assessment/Plan  51 y.o. female with medical history significant of recent fracture of head of Left radius admitted now for left femoral neck fracture  Present on Admission: . Closed left hip fracture (HCC) - appreciate orthopedics consult. Given no prior history  of coronary artery disease or other risk factors as well as excellent exercise tolerant and recent tolerance of orthopedic procedure no further cardiac workup indicated prior to proceeding to OR . Leukocytosis -  likely stress related, no history of fevers or chills to suggest infectious process   Other plan as per orders.  DVT prophylaxis:  SCD      Code Status:  FULL CODE as per patient    Family Communication:   Family not  at  Bedside     Disposition Plan:    likely will need placement for rehabilitation                                                  Would benefit from PT/OT eval prior to DC  Order once cleared by Orthopedics                             Consults called: Orthopedics Duwayne Heck  Admission status:    inpatient       Level of care      medical floor            I have spent a total of 56 min on this admission    Tzivia Oneil 08/29/2016, 1:05 AM    [facility]  Pager [phone]   after 2 AM please page floor coverage PA If 7AM-7PM, please contact the day team taking care of the patient  [email or web address]  Password [email or web address]

## 2016-08-29 NOTE — Consult Note (Addendum)
ORTHOPAEDIC CONSULTATION  REQUESTING PHYSICIAN: Osvaldo Shipper, MD  PCP:  Default, Provider, MD  Chief Complaint: Left femoral neck fracture  HPI: Lori Flores is a 51 y.o. female who complains of left hip injury that occurred yesterday. Patient states that she was trying to dismount a One Wheel skateboard, when she lost control of the device, and fell directly onto her bottom. She heard a pop in her left hip. She noticed immediate left hip pain. She was unable to weight-bear. She was taken to the emergency department at [facility], where x-rays and a CT scan of the hip revealed a vertically oriented, comminuted transcervical femoral neck fracture. She was transferred to [facility] for definitive management. She was admitted by the hospitalist. She complains of left hip pain. She denies other injuries.  Past Medical History:  Diagnosis Date  . Radial head fracture    History reviewed. No pertinent surgical history. Social History   Social History  . Marital status: Married    Spouse name: N/A  . Number of children: N/A  . Years of education: N/A   Social History Main Topics  . Smoking status: Never Smoker  . Smokeless tobacco: Never Used  . Alcohol use No  . Drug use: No  . Sexual activity: Not Asked   Other Topics Concern  . None   Social History Narrative  . None   Family History  Problem Relation Age of Onset  . Hypertension Neg Hx   . Diabetes Neg Hx   . CAD Neg Hx    No Known Allergies Prior to Admission medications   Medication Sig Start Date End Date Taking? Authorizing Provider  amphetamine-dextroamphetamine (ADDERALL) 30 MG tablet Take 15-30 mg by mouth 2 (two) times daily. Take 30mg  by mouth at 12 noon and take 15 mg by mouth at 4pm.   Yes Historical Provider, MD   Ct Hip Left Wo Contrast  Result Date: 08/28/2016 CLINICAL DATA:  Pt brought from home via EMS for fall off skateboard. Pt states she was riding and 'did a split and felt  something pop in left hip'. No obvious deformity in L hip, limited ROM & severe pain w/movement, full sensation & strength L foot, +2 pulses. EXAM: CT OF THE LEFT HIP WITHOUT CONTRAST TECHNIQUE: Multidetector CT imaging of the left hip was performed according to the standard protocol. Multiplanar CT image reconstructions were also generated. COMPARISON:  None. FINDINGS: There is mildly comminuted and displaced fracture of the left femoral neck. This extends from the superior femoral head neck junction to the inferior mid cervical region. The fracture is mildly displaced, by 9 mm, with the distal fracture component retracting superiorly. No significant fracture angulation. The left hip joint is normally spaced and aligned. There are no other fractures. Mild edema is seen in the directly adjacent left hip deep soft tissues. There is a small hemarthrosis. Visible structures within the pelvis are unremarkable. Surrounding muscular structures are unremarkable. The tendons are intact. IMPRESSION: 1. Mildly comminuted and displaced fracture of the left femoral neck as described. Electronically Signed   By: Amie Portland M.D.   On: 08/28/2016 23:03   Dg Chest Portable 1 View  Result Date: 08/28/2016 CLINICAL DATA:  Fall off skateboard EXAM: PORTABLE CHEST 1 VIEW COMPARISON:  None. FINDINGS: The heart size and mediastinal contours are within normal limits. Both lungs are clear. The visualized skeletal structures are unremarkable. IMPRESSION: No active disease. Electronically Signed   By: Adrian Prows.D.  On: 08/28/2016 21:30   Dg Hip Unilat With Pelvis 2-3 Views Left  Result Date: 08/28/2016 CLINICAL DATA:  Fall off a skateboard EXAM: DG HIP (WITH OR WITHOUT PELVIS) 2-3V LEFT COMPARISON:  None. FINDINGS: The SI joints are symmetric bilaterally. Pubic symphysis and rami appear intact. The right femoral head projects in joint. The left femoral head projects in joint. There is step-off deformity at the left femoral  head neck junction. IMPRESSION: 1. Mild step-off deformity at the left femoral head neck junction suspicious for a fracture. Suggest CT for further evaluation. 2. Right hip within normal limits Electronically Signed   By: Jasmine Pang M.D.   On: 08/28/2016 21:30    Positive ROS: All other systems have been reviewed and were otherwise negative with the exception of those mentioned in the HPI and as above.  Physical Exam: General: Alert, no acute distress Cardiovascular: No pedal edema Respiratory: No cyanosis, no use of accessory musculature GI: No organomegaly, abdomen is soft and non-tender Skin: No lesions in the area of chief complaint Neurologic: Sensation intact distally Psychiatric: Patient is competent for consent with normal mood and affect Lymphatic: No axillary or cervical lymphadenopathy  MUSCULOSKELETAL: Examination of bilateral upper extremities and right lower extremity reveals no skin wounds or lesions. No crepitation. No tenderness to palpation. Full painless range of motion. Neurovascularly intact.  Examination of the left lower extremity reveals no skin wounds or lesions she is mildly shortened. She has pain with attempted logrolling of the hip. She has palpable pedal pulses. She has positive motor function dorsiflexion, plantarflexion, and great toe extension. She reports intact sensation to light touch in all distributions.  Assessment: Comminuted left femoral neck fracture  Plan: I discussed the findings with the patient. We discussed the morphology of her fracture pattern. Given the vertical nature, the level of comminution, and her young age and activity level, I have recommended a total hip arthroplasty as the most durable solution for her injury. The patient became very tearful, and she states that she has heard "horror stories about hip replacements." She indicates that she is a personal friend of Dr. Hurshel Keys, and she would like his advice before agreeing to  any surgical procedure. I offered her the option of consultation his orthopedic group, and she tells me at that this time she does not want to pursue that course. I explained to the patient that normally patients have better outcomes if these injuries are treated acutely, as there are increased risks of potential morbidity and mortality due to immobility. Therefore, I offered her surgery today. Again, she wants to speak with Dr. Jerl Santos and her husband before agreeing to surgery. She agreed to being placed on the surgery schedule tomorrow. We did discuss the risks, benefits, and alternatives to left total hip arthroplasty. Please see statement of risk. Nothing by mouth after midnight tonight. Hold chemical DVT prophylaxis.  The risks, benefits, and alternatives were discussed with the patient. There are risks associated with the surgery including, but not limited to, problems with anesthesia (death), infection, instability (giving out of the joint), dislocation, differences in leg length/angulation/rotation, fracture of bones, loosening or failure of implants, hematoma (blood accumulation) which may require surgical drainage, blood clots, pulmonary embolism, nerve injury (foot drop and lateral thigh numbness), and blood vessel injury. The patient understands these risks and elects to proceed.   ADDENDUM: Will give lovenox today and order diet. NPO after MN tonight. Hold chemical DVT ppx after MN tonight.   Trenesha Alcaide, Cloyde Reams,  MD Cell [phone]    08/29/2016 8:38 AM

## 2016-08-29 NOTE — Progress Notes (Signed)
Patient adamantly refuses skin assessment because she does not want to be moved again. We had to move her from stretcher to bed when [facility].  As per patient "the morphine does not take the pain away!!  Why do you have to do this!! My husband is an attorney and I will own up to any bruises or skin issues later!! I want to go home!! This is worse than when I broke my leg!!  Pain medication was administered. I educated patient on hospital policy on head to toe skin assessment requirements upon admission.  Patient has a right to refuse.  Thorough skin assessment was not done.

## 2016-08-30 ENCOUNTER — Inpatient Hospital Stay (HOSPITAL_COMMUNITY): Payer: BLUE CROSS/BLUE SHIELD | Admitting: Certified Registered Nurse Anesthetist

## 2016-08-30 ENCOUNTER — Encounter (HOSPITAL_COMMUNITY): Payer: Self-pay | Admitting: Certified Registered Nurse Anesthetist

## 2016-08-30 ENCOUNTER — Inpatient Hospital Stay (HOSPITAL_COMMUNITY): Payer: BLUE CROSS/BLUE SHIELD

## 2016-08-30 ENCOUNTER — Encounter (HOSPITAL_COMMUNITY)
Admission: EM | Disposition: A | Discharge status: Home Health | Payer: Self-pay | Source: Home / Self Care | Attending: Internal Medicine

## 2016-08-30 HISTORY — PX: ANTERIOR APPROACH HEMI HIP ARTHROPLASTY: SHX6690

## 2016-08-30 LAB — I-STAT BETA HCG BLOOD, ED (NOT ORDERABLE): I-stat hCG, quantitative: <5 m[IU]/mL (ref <5)

## 2016-08-30 LAB — CBC
HCT: 36.8 % (ref 36.0–46.0)
HEMOGLOBIN: 12 g/dL (ref 12.0–15.0)
MCH: 29.1 pg (ref 26.0–34.0)
MCHC: 32.6 g/dL (ref 30.0–36.0)
MCV: 89.3 fL (ref 78.0–100.0)
Platelets: 264 10*3/uL (ref 150–400)
RBC: 4.12 MIL/uL (ref 3.87–5.11)
RDW: 13.1 % (ref 11.5–15.5)
WBC: 8.3 10*3/uL (ref 4.0–10.5)

## 2016-08-30 LAB — BASIC METABOLIC PANEL
Anion gap: 6 (ref 5–15)
BUN: 5 mg/dL — AB (ref 6–20)
CHLORIDE: 105 mmol/L (ref 101–111)
CO2: 27 mmol/L (ref 22–32)
Calcium: 8.4 mg/dL — ABNORMAL LOW (ref 8.9–10.3)
Creatinine, Ser: 0.56 mg/dL (ref 0.44–1.00)
GFR calc Af Amer: >60 mL/min (ref >60)
GFR calc non Af Amer: >60 mL/min (ref >60)
GLUCOSE: 96 mg/dL (ref 65–99)
POTASSIUM: 3.7 mmol/L (ref 3.5–5.1)
Sodium: 138 mmol/L (ref 135–145)

## 2016-08-30 LAB — HIV ANTIBODY (ROUTINE TESTING W REFLEX): HIV SCREEN 4TH GENERATION: NONREACTIVE

## 2016-08-30 LAB — VITAMIN D 25 HYDROXY (VIT D DEFICIENCY, FRACTURES): Vit D, 25-Hydroxy: 19.4 ng/mL — ABNORMAL LOW (ref 30.0–100.0)

## 2016-08-30 LAB — MAGNESIUM: Magnesium: 1.6 mg/dL — ABNORMAL LOW (ref 1.7–2.4)

## 2016-08-30 SURGERY — HEMIARTHROPLASTY, HIP, DIRECT ANTERIOR APPROACH, FOR FRACTURE
Anesthesia: General | Laterality: Left

## 2016-08-30 MED ORDER — OXYCODONE HCL 5 MG PO TABS
5.0000 mg | ORAL_TABLET | ORAL | Status: DC | PRN
  Administered 2016-08-30 (×2): 10 mg via ORAL
  Filled 2016-08-30: qty 2

## 2016-08-30 MED ORDER — BUPIVACAINE LIPOSOME 1.3 % IJ SUSP
20.0000 mL | Freq: Once | INTRAMUSCULAR | Status: AC
  Administered 2016-08-30: 20 mL
  Filled 2016-08-30: qty 20

## 2016-08-30 MED ORDER — FENTANYL CITRATE (PF) 100 MCG/2ML IJ SOLN
INTRAMUSCULAR | Status: AC
  Filled 2016-08-30: qty 2

## 2016-08-30 MED ORDER — BUPIVACAINE-EPINEPHRINE (PF) 0.5% -1:200000 IJ SOLN
INTRAMUSCULAR | Status: AC
  Filled 2016-08-30: qty 30

## 2016-08-30 MED ORDER — ONDANSETRON HCL 4 MG/2ML IJ SOLN
INTRAMUSCULAR | Status: AC
  Filled 2016-08-30: qty 2

## 2016-08-30 MED ORDER — ASPIRIN EC 325 MG PO TBEC
325.0000 mg | DELAYED_RELEASE_TABLET | Freq: Every day | ORAL | Status: DC
  Filled 2016-08-30: qty 1

## 2016-08-30 MED ORDER — ROCURONIUM BROMIDE 10 MG/ML (PF) SYRINGE
PREFILLED_SYRINGE | INTRAVENOUS | Status: DC | PRN
  Administered 2016-08-30: 50 mg via INTRAVENOUS

## 2016-08-30 MED ORDER — TRANEXAMIC ACID 1000 MG/10ML IV SOLN
2000.0000 mg | Freq: Once | INTRAVENOUS | Status: AC
  Administered 2016-08-30: 2000 mg via TOPICAL
  Filled 2016-08-30: qty 20

## 2016-08-30 MED ORDER — SUGAMMADEX SODIUM 200 MG/2ML IV SOLN
INTRAVENOUS | Status: DC | PRN
  Administered 2016-08-30: 100 mg via INTRAVENOUS

## 2016-08-30 MED ORDER — ONDANSETRON HCL 4 MG PO TABS: 4.0000 mg | ORAL_TABLET | Freq: Four times a day (QID) | ORAL | Status: DC | PRN

## 2016-08-30 MED ORDER — ASPIRIN EC 325 MG PO TBEC: 325.0000 mg | DELAYED_RELEASE_TABLET | Freq: Two times a day (BID) | ORAL | 0 refills | Status: AC

## 2016-08-30 MED ORDER — ALUMINUM HYDROXIDE GEL 320 MG/5ML PO SUSP
15.0000 mL | ORAL | Status: DC | PRN
  Filled 2016-08-30: qty 30

## 2016-08-30 MED ORDER — FENTANYL CITRATE (PF) 100 MCG/2ML IJ SOLN
INTRAMUSCULAR | Status: AC
Start: 2016-08-30 — End: 2016-08-30
  Filled 2016-08-30: qty 2

## 2016-08-30 MED ORDER — METHOCARBAMOL 500 MG PO TABS: 500.0000 mg | ORAL_TABLET | Freq: Four times a day (QID) | ORAL | Status: DC | PRN

## 2016-08-30 MED ORDER — DEXAMETHASONE SODIUM PHOSPHATE 10 MG/ML IJ SOLN
10.0000 mg | Freq: Once | INTRAMUSCULAR | Status: AC
  Administered 2016-08-31: 10 mg via INTRAVENOUS
  Filled 2016-08-30: qty 1

## 2016-08-30 MED ORDER — ONDANSETRON HCL 4 MG/2ML IJ SOLN
4.0000 mg | Freq: Four times a day (QID) | INTRAMUSCULAR | Status: DC | PRN
Start: 2016-08-30 — End: 2016-09-01
  Administered 2016-08-31 (×2): 4 mg via INTRAVENOUS
  Filled 2016-08-30 (×2): qty 2

## 2016-08-30 MED ORDER — OXYCODONE HCL 5 MG PO TABS: 5.0000 mg | ORAL_TABLET | Freq: Once | ORAL | Status: DC | PRN

## 2016-08-30 MED ORDER — KCL IN DEXTROSE-NACL 20-5-0.45 MEQ/L-%-% IV SOLN
INTRAVENOUS | Status: DC
  Administered 2016-08-30: 21:00:00 via INTRAVENOUS
  Filled 2016-08-30: qty 1000

## 2016-08-30 MED ORDER — SUGAMMADEX SODIUM 200 MG/2ML IV SOLN
INTRAVENOUS | Status: AC
  Filled 2016-08-30: qty 2

## 2016-08-30 MED ORDER — DOCUSATE SODIUM 100 MG PO CAPS
100.0000 mg | ORAL_CAPSULE | Freq: Two times a day (BID) | ORAL | Status: DC
  Administered 2016-08-30 – 2016-09-01 (×3): 100 mg via ORAL
  Filled 2016-08-30 (×4): qty 1

## 2016-08-30 MED ORDER — FENTANYL CITRATE (PF) 100 MCG/2ML IJ SOLN
INTRAMUSCULAR | Status: DC | PRN
  Administered 2016-08-30 (×3): 100 ug via INTRAVENOUS

## 2016-08-30 MED ORDER — METHOCARBAMOL 1000 MG/10ML IJ SOLN: 500.0000 mg | Freq: Four times a day (QID) | INTRAMUSCULAR | Status: DC | PRN

## 2016-08-30 MED ORDER — MIDAZOLAM HCL 2 MG/2ML IJ SOLN
INTRAMUSCULAR | Status: AC
  Filled 2016-08-30: qty 2

## 2016-08-30 MED ORDER — CEFAZOLIN SODIUM-DEXTROSE 2-4 GM/100ML-% IV SOLN
INTRAVENOUS | Status: AC
  Filled 2016-08-30: qty 100

## 2016-08-30 MED ORDER — OXYCODONE HCL 5 MG/5ML PO SOLN: 5.0000 mg | Freq: Once | ORAL | Status: DC | PRN

## 2016-08-30 MED ORDER — PHENOL 1.4 % MT LIQD: 1.0000 | OROMUCOSAL | Status: DC | PRN

## 2016-08-30 MED ORDER — LIDOCAINE 2% (20 MG/ML) 5 ML SYRINGE
INTRAMUSCULAR | Status: AC
  Filled 2016-08-30: qty 5

## 2016-08-30 MED ORDER — ACETAMINOPHEN 650 MG RE SUPP: 650.0000 mg | Freq: Four times a day (QID) | RECTAL | Status: DC | PRN

## 2016-08-30 MED ORDER — FENTANYL CITRATE (PF) 100 MCG/2ML IJ SOLN
INTRAMUSCULAR | Status: AC
  Administered 2016-08-30: 50 ug via INTRAVENOUS
  Filled 2016-08-30: qty 2

## 2016-08-30 MED ORDER — CEFAZOLIN SODIUM-DEXTROSE 2-3 GM-% IV SOLR
INTRAVENOUS | Status: DC | PRN
  Administered 2016-08-30: 2 g via INTRAVENOUS

## 2016-08-30 MED ORDER — OXYCODONE-ACETAMINOPHEN 5-325 MG PO TABS: 1.0000 | ORAL_TABLET | ORAL | 0 refills | Status: AC | PRN

## 2016-08-30 MED ORDER — METHOCARBAMOL 500 MG PO TABS
500.0000 mg | ORAL_TABLET | Freq: Four times a day (QID) | ORAL | Status: DC | PRN
  Administered 2016-08-31 (×2): 500 mg via ORAL
  Filled 2016-08-30 (×4): qty 1

## 2016-08-30 MED ORDER — BUPIVACAINE-EPINEPHRINE 0.5% -1:200000 IJ SOLN
INTRAMUSCULAR | Status: DC | PRN
  Administered 2016-08-30: 30 mL

## 2016-08-30 MED ORDER — ACETAMINOPHEN 325 MG PO TABS: 650.0000 mg | ORAL_TABLET | Freq: Four times a day (QID) | ORAL | Status: DC | PRN

## 2016-08-30 MED ORDER — PROPOFOL 10 MG/ML IV BOLUS
INTRAVENOUS | Status: DC | PRN
  Administered 2016-08-30: 160 mg via INTRAVENOUS

## 2016-08-30 MED ORDER — MENTHOL 3 MG MT LOZG: 1.0000 | LOZENGE | OROMUCOSAL | Status: DC | PRN

## 2016-08-30 MED ORDER — LACTATED RINGERS IV SOLN
INTRAVENOUS | Status: DC
  Administered 2016-08-30 (×3): via INTRAVENOUS

## 2016-08-30 MED ORDER — DIPHENHYDRAMINE HCL 12.5 MG/5ML PO ELIX: 12.5000 mg | ORAL_SOLUTION | ORAL | Status: DC | PRN

## 2016-08-30 MED ORDER — PHENYLEPHRINE 40 MCG/ML (10ML) SYRINGE FOR IV PUSH (FOR BLOOD PRESSURE SUPPORT)
PREFILLED_SYRINGE | INTRAVENOUS | Status: DC | PRN
  Administered 2016-08-30 (×3): 80 ug via INTRAVENOUS

## 2016-08-30 MED ORDER — HYDROMORPHONE HCL 1 MG/ML IJ SOLN
1.0000 mg | INTRAMUSCULAR | Status: DC | PRN
Start: 2016-08-30 — End: 2016-08-30

## 2016-08-30 MED ORDER — TRANEXAMIC ACID 1000 MG/10ML IV SOLN: 1000.0000 mg | Freq: Once | INTRAVENOUS | Status: DC

## 2016-08-30 MED ORDER — BISACODYL 5 MG PO TBEC: 5.0000 mg | DELAYED_RELEASE_TABLET | Freq: Every day | ORAL | Status: DC | PRN

## 2016-08-30 MED ORDER — METOCLOPRAMIDE HCL 5 MG PO TABS: 5.0000 mg | ORAL_TABLET | Freq: Three times a day (TID) | ORAL | Status: DC | PRN

## 2016-08-30 MED ORDER — POLYETHYLENE GLYCOL 3350 17 G PO PACK: 17.0000 g | PACK | Freq: Every day | ORAL | Status: DC | PRN

## 2016-08-30 MED ORDER — HYDROCODONE-ACETAMINOPHEN 5-325 MG PO TABS
1.0000 | ORAL_TABLET | Freq: Four times a day (QID) | ORAL | Status: DC | PRN
  Administered 2016-08-31 (×2): 1 via ORAL
  Filled 2016-08-30: qty 2
  Filled 2016-08-30 (×2): qty 1

## 2016-08-30 MED ORDER — ROCURONIUM BROMIDE 50 MG/5ML IV SOSY
PREFILLED_SYRINGE | INTRAVENOUS | Status: AC
  Filled 2016-08-30: qty 5

## 2016-08-30 MED ORDER — FENTANYL CITRATE (PF) 100 MCG/2ML IJ SOLN
50.0000 ug | Freq: Once | INTRAMUSCULAR | Status: AC
  Administered 2016-08-30: 50 ug via INTRAVENOUS
  Filled 2016-08-30: qty 1

## 2016-08-30 MED ORDER — FLEET ENEMA 7-19 GM/118ML RE ENEM: 1.0000 | ENEMA | Freq: Once | RECTAL | Status: DC | PRN

## 2016-08-30 MED ORDER — TRANEXAMIC ACID 1000 MG/10ML IV SOLN
1000.0000 mg | INTRAVENOUS | Status: AC
  Administered 2016-08-30: 1000 mg via INTRAVENOUS
  Filled 2016-08-30: qty 10

## 2016-08-30 MED ORDER — METOCLOPRAMIDE HCL 5 MG/ML IJ SOLN
5.0000 mg | Freq: Three times a day (TID) | INTRAMUSCULAR | Status: DC | PRN
  Administered 2016-08-31: 10 mg via INTRAVENOUS
  Filled 2016-08-30: qty 2

## 2016-08-30 MED ORDER — FENTANYL CITRATE (PF) 100 MCG/2ML IJ SOLN
25.0000 ug | INTRAMUSCULAR | Status: DC | PRN
  Administered 2016-08-30 (×2): 50 ug via INTRAVENOUS

## 2016-08-30 MED ORDER — HYDROMORPHONE HCL 2 MG/ML IJ SOLN
1.0000 mg | INTRAMUSCULAR | Status: DC | PRN
  Administered 2016-08-30 – 2016-08-31 (×3): 1 mg via INTRAVENOUS
  Filled 2016-08-30 (×3): qty 1

## 2016-08-30 MED ORDER — ONDANSETRON HCL 4 MG/2ML IJ SOLN: 4.0000 mg | Freq: Once | INTRAMUSCULAR | Status: DC | PRN

## 2016-08-30 MED ORDER — CELECOXIB 200 MG PO CAPS
200.0000 mg | ORAL_CAPSULE | Freq: Two times a day (BID) | ORAL | Status: DC
  Administered 2016-08-30 – 2016-09-01 (×3): 200 mg via ORAL
  Filled 2016-08-30 (×4): qty 1

## 2016-08-30 MED ORDER — OXYCODONE HCL 5 MG PO TABS
ORAL_TABLET | ORAL | Status: AC
Start: 2016-08-30 — End: 2016-08-31
  Filled 2016-08-30: qty 2

## 2016-08-30 MED ORDER — MIDAZOLAM HCL 5 MG/5ML IJ SOLN
INTRAMUSCULAR | Status: DC | PRN
  Administered 2016-08-30: 2 mg via INTRAVENOUS

## 2016-08-30 MED ORDER — HYDROMORPHONE HCL 2 MG/ML IJ SOLN: 1.0000 mg | INTRAMUSCULAR | Status: DC | PRN

## 2016-08-30 MED ORDER — ONDANSETRON HCL 4 MG/2ML IJ SOLN
INTRAMUSCULAR | Status: DC | PRN
  Administered 2016-08-30: 4 mg via INTRAVENOUS

## 2016-08-30 MED ORDER — PHENYLEPHRINE 40 MCG/ML (10ML) SYRINGE FOR IV PUSH (FOR BLOOD PRESSURE SUPPORT)
PREFILLED_SYRINGE | INTRAVENOUS | Status: AC
  Filled 2016-08-30: qty 10

## 2016-08-30 MED ORDER — GABAPENTIN 300 MG PO CAPS
300.0000 mg | ORAL_CAPSULE | Freq: Three times a day (TID) | ORAL | Status: DC
  Administered 2016-08-30 – 2016-09-01 (×4): 300 mg via ORAL
  Filled 2016-08-30 (×4): qty 1

## 2016-08-30 MED ORDER — METHOCARBAMOL 500 MG PO TABS: 500.0000 mg | ORAL_TABLET | Freq: Two times a day (BID) | ORAL | 0 refills | Status: AC

## 2016-08-30 SURGICAL SUPPLY — 50 items
BLADE SAW SGTL 18X1.27X75 (BLADE) ×2 IMPLANT
BLADE SAW SGTL 18X1.27X75MM (BLADE) ×1
CAPT HIP TOTAL 2 ×2 IMPLANT
COVER PERINEAL POST (MISCELLANEOUS) ×3 IMPLANT
COVER SURGICAL LIGHT HANDLE (MISCELLANEOUS) ×3 IMPLANT
DRAPE C-ARM 42X72 X-RAY (DRAPES) ×3 IMPLANT
DRAPE PROXIMA HALF (DRAPES) IMPLANT
DRAPE U-SHAPE 47X51 STRL (DRAPES) ×6 IMPLANT
DRSG AQUACEL AG ADV 3.5X10 (GAUZE/BANDAGES/DRESSINGS) IMPLANT
DRSG AQUACEL AG ADV 3.5X14 (GAUZE/BANDAGES/DRESSINGS) IMPLANT
DURAPREP 26ML APPLICATOR (WOUND CARE) ×3 IMPLANT
ELECT BLADE 4.0 EZ CLEAN MEGAD (MISCELLANEOUS)
ELECT REM PT RETURN 9FT ADLT (ELECTROSURGICAL) ×3
ELECTRODE BLDE 4.0 EZ CLN MEGD (MISCELLANEOUS) IMPLANT
ELECTRODE REM PT RTRN 9FT ADLT (ELECTROSURGICAL) ×1 IMPLANT
GLOVE BIO SURGEON STRL SZ7.5 (GLOVE) ×3 IMPLANT
GLOVE BIO SURGEON STRL SZ8.5 (GLOVE) ×6 IMPLANT
GLOVE BIOGEL PI IND STRL 8 (GLOVE) ×2 IMPLANT
GLOVE BIOGEL PI IND STRL 9 (GLOVE) ×1 IMPLANT
GLOVE BIOGEL PI INDICATOR 8 (GLOVE) ×4
GLOVE BIOGEL PI INDICATOR 9 (GLOVE) ×2
GOWN STRL REUS W/ TWL LRG LVL3 (GOWN DISPOSABLE) IMPLANT
GOWN STRL REUS W/ TWL XL LVL3 (GOWN DISPOSABLE) ×2 IMPLANT
GOWN STRL REUS W/TWL LRG LVL3 (GOWN DISPOSABLE)
GOWN STRL REUS W/TWL XL LVL3 (GOWN DISPOSABLE) ×6
HANDPIECE INTERPULSE COAX TIP (DISPOSABLE)
HOOD PEEL AWAY FACE SHEILD DIS (HOOD) IMPLANT
KIT BASIN OR (CUSTOM PROCEDURE TRAY) ×3 IMPLANT
KIT ROOM TURNOVER OR (KITS) ×3 IMPLANT
MANIFOLD NEPTUNE II (INSTRUMENTS) ×3 IMPLANT
NEEDLE 22X1 1/2 (OR ONLY) (NEEDLE) ×3 IMPLANT
NS IRRIG 1000ML POUR BTL (IV SOLUTION) ×3 IMPLANT
PACK TOTAL JOINT (CUSTOM PROCEDURE TRAY) ×3 IMPLANT
PAD ARMBOARD 7.5X6 YLW CONV (MISCELLANEOUS) ×6 IMPLANT
PASSER SUT SWANSON 36MM LOOP (INSTRUMENTS) ×1 IMPLANT
SET HNDPC FAN SPRY TIP SCT (DISPOSABLE) IMPLANT
SUT ETHIBOND 2 V 37 (SUTURE) ×3 IMPLANT
SUT VIC AB 0 CTX 36 (SUTURE) ×3
SUT VIC AB 0 CTX36XBRD ANTBCTR (SUTURE) ×1 IMPLANT
SUT VIC AB 1 CTX 36 (SUTURE) ×3
SUT VIC AB 1 CTX36XBRD ANBCTR (SUTURE) ×1 IMPLANT
SUT VIC AB 2-0 CTB1 (SUTURE) ×3 IMPLANT
SUT VIC AB 3-0 CT1 27 (SUTURE) ×3
SUT VIC AB 3-0 CT1 TAPERPNT 27 (SUTURE) ×1 IMPLANT
SYR CONTROL 10ML LL (SYRINGE) ×6 IMPLANT
TOWEL OR 17X24 6PK STRL BLUE (TOWEL DISPOSABLE) ×1 IMPLANT
TOWEL OR 17X26 10 PK STRL BLUE (TOWEL DISPOSABLE) ×1 IMPLANT
TRAY CATH 16FR W/PLASTIC CATH (SET/KITS/TRAYS/PACK) IMPLANT
TRAY FOLEY CATH SILVER 14FR (SET/KITS/TRAYS/PACK) ×2 IMPLANT
WATER STERILE IRR 1000ML POUR (IV SOLUTION) ×4 IMPLANT

## 2016-08-30 NOTE — Anesthesia Procedure Notes (Signed)
Procedure Name: Intubation Date/Time: 08/30/2016 2:32 PM Performed by: Melina Copa, Jamar Weatherall R Pre-anesthesia Checklist: Patient identified, Emergency Drugs available, Suction available and Patient being monitored Patient Re-evaluated:Patient Re-evaluated prior to inductionOxygen Delivery Method: Circle System Utilized Preoxygenation: Pre-oxygenation with 100% oxygen Intubation Type: IV induction Ventilation: Mask ventilation without difficulty Laryngoscope Size: Mac and 3 Grade View: Grade I Tube type: Oral Tube size: 7.5 mm Number of attempts: 1 Airway Equipment and Method: Stylet Placement Confirmation: ETT inserted through vocal cords under direct vision,  positive ETCO2 and breath sounds checked- equal and bilateral Secured at: 21 cm Tube secured with: Tape Dental Injury: Teeth and Oropharynx as per pre-operative assessment

## 2016-08-30 NOTE — Transfer of Care (Signed)
Immediate Anesthesia Transfer of Care Note  Patient: Charise KillianEngle H Zuelke  Procedure(s) Performed: Procedure(s): ANTERIOR APPROACH HEMI HIP ARTHROPLASTY (Left)  Patient Location: PACU  Anesthesia Type:General  Level of Consciousness: awake, oriented and patient cooperative  Airway & Oxygen Therapy: Patient Spontanous Breathing and Patient connected to nasal cannula oxygen  Post-op Assessment: Report given to RN, Post -op Vital signs reviewed and stable and Patient moving all extremities  Post vital signs: Reviewed and stable  Last Vitals:  Vitals:   08/30/16 1353 08/30/16 1400  BP:    Pulse: 83 82  Resp: 16 18  Temp:      Last Pain:  Vitals:   08/30/16 1400  TempSrc:   PainSc: 5       Patients Stated Pain Goal: 0 (08/30/16 0604)  Complications: No apparent anesthesia complications

## 2016-08-30 NOTE — Op Note (Signed)
OPERATIVE REPORT    DATE OF PROCEDURE:  08/30/2016       PREOPERATIVE DIAGNOSIS:  LEFT FEMORAL NECK FRACTURE                                                          POSTOPERATIVE DIAGNOSIS:  same                                                            PROCEDURE: Anterior L total hip arthroplasty using a 48 mm DePuy Pinnacle  Cup, [place]ody Energypex Hole Eliminator, 0-degree polyethylene liner, a +32 mm ceramic head, a 4 Depuy Triloc stem   SURGEON: Gean BirchwoodOWAN,Anh Bigos J    ASSISTANT:   Eric K. [employer] Energy[employer]illips PA-C  (present throughout entire procedure and necessary for timely completion of the procedure)   ANESTHESIA: GET BLOOD LOSS: 750cc FLUID REPLACEMENT: 1500 crystalloid Antibiotic: 1gm Vancomycin Tranexamic Acid: 1gm, 2gm COMPLICATIONS: none    INDICATIONS FOR PROCEDURE: A 51 y.o. year-old With  LEFT FEMORAL NECK FRACTURE. Fracture has a vertical, comminuted, and displaced. In order to decrease pain and increase function we have discussed total hip replacement during immediately and does not carry the risk of nonunion is an attempt at open reduction internal fixation. The risks, benefits, and alternatives were discussed at length including but not limited to the risks of infection, bleeding, nerve injury, stiffness, blood clots, the need for revision surgery, cardiopulmonary complications, among others, and they were willing to proceed. Questions answered     PROCEDURE IN DETAIL: The patient was identified by armband,  [facility], taken to the operating room , appropriate anesthetic monitors  were attached and  anesthesia was induced with the patienton the gurney. The HANA boots were applied to the feet and he was then transferred to the HANA table with a peroneal post and support underneath the non-operative le, which was locked in 5 lb traction. Theoperative lower extremity was then prepped and draped in the usual sterile fashion  from just above the iliac crest to the knee. And a timeout procedure was performed. We then made a 15 cm incision along the interval at the leading edge of the tensor fascia lata of starting at 2 cm lateral to and 2 cm distal to the ASIS. Small bleeders in the skin and subcutaneous tissue identified and cauterized we dissected down to the fascia and made an incision in the fascia allowing us to elevate the fascia of the tensor muscle and exploited the interval between the rectus and the tensor fascia lata. A Hohmann retractor was then placed along the superior neck of the femur and a Cobra retractor along the inferior neck of the femur we teed the capsule starting out at the superior anterior aspect of the acetabulum going distally and made the T along the neck both leaflets of the T were tagged with #2 Ethibond suture. Cobra retractors were then placed along the inferior and superior neck allowing us to visualize the comminuted fracture of the femoral neck. A standard neck cut was made 1/2 fingerbreadth above the lesser trochanter  to stay away from the stress riser of the fracture. In removed the femoral head with a power corkscrew. We then placed a right angle Hohmann retractor along the anterior aspect of the acetabulum a spiked Cobra in the cotyloid notch and posteriorly a Muelller retractor. We then sequentially reamed up to a 47 mm basket reamer obtaining good coverage in all quadrants, verified by C-arm imaging. Under C-arm control with and hammered into place a 48 mm Pinnacle cup in 45 of abduction and 15 of anteversion. The cup seated nicely and required no supplemental screws. We then placed a central hole Eliminator and a 0 polyethylene liner. The foot was then externally rotated to 110, the HANA elevator was placed around the flare of the greater trochanter and the limb was extended and abducted delivering the proximal femur up into the wound. A medium Hohmann retractor was placed over the greater  trochanter and a Mueller retractor along the posterior femoral neck completing the exposure. We then performed releases superiorly and and inferiorly of the capsule going back to the pirformis fossa superiorly and to the lesser trochanter inferiorly. We then entered the proximal femur with the box cutting offset chisel followed by, a canal sounder, the chili pepper and broaching up to a 4 broach. This seated nicely and we reamed the calcar. A trial reduction was performed with a +9 32 mm head.The limb lengths were excellent the hip was stable in 90 of external rotation. At this point the trial components removed and we hammered into place a # 4 Tri-Lock stem with Gryption coating. This was a std offset stem and a + 9 32 mm ceramic ball was then hammered into place the hip was reduced and final C-arm images obtained. The wound was thoroughly irrigated with normal saline solution. We repaired the ant capsule and the tensor fascia lot a with running 0 vicryl suture. the subcutaneous tissue was closed with 2-0 and 3-0 Vicryl suture followed by an Aquacil dressing. At this point the patient was awaken and transferred to hospital gurney without difficulty. The subcutaneous tissue with 0 and 2-0 undyed Vicryl suture and the skin with running  3-0 vicryl subcuticular suture. Aquacil dressing was applied. The patient was then unclamped, rolled supine, awaken extubated and taken to recovery room without difficulty in stable condition.   Gean Birchwood J 08/30/2016, 4:13 PM

## 2016-08-30 NOTE — Progress Notes (Signed)
[facility] PROGRESS NOTE  Lori Flores ZOX:[id number] DOB: 1965-12-12 DOA: 08/28/2016  PCP: Default, Provider, MD  Brief History/Interval Summary: 51 year old Caucasian female with no significant past medical history who had a radial head fracture recently, which was treated with a splint and has fully recovered. The day prior to this hospitalization she was on a motorized skateboard and she fell off of it. She heard a popping sound in her left hip and was found to have a fracture. Hospitalized for further management.  Reason for Visit: Left hip fracture  Consultants: Orthopedics  Procedures: None yet  Antibiotics: None  Subjective/Interval History: Patient continues to have pain in her hip. Well controlled with the current medication. Denies any other complaints.   ROS: Denies headaches. Denies any shortness of breath, nausea, vomiting.  Objective:  Vital Signs  Vitals:   08/29/16 0500 08/29/16 1451 08/29/16 2118 08/30/16 0426  BP: 121/69 108/63 114/63 119/66  Pulse: (!) 111 81 90 94  Resp:  16 16 16   Temp: 98.4 F (36.9 C) 98.3 F (36.8 C) 99.4 F (37.4 C) 99.8 F (37.7 C)  TempSrc: Oral Oral Oral Oral  SpO2: 98% 98% 98% 92%  Weight:      Height:        Intake/Output Summary (Last 24 hours) at 08/30/16 0739 Last data filed at 08/29/16 1730  Gross per 24 hour  Intake           1327.5 ml  Output                0 ml  Net           1327.5 ml   Filed Weights   08/28/16 2002  Weight: 68 kg (150 lb)    General appearance: alert, cooperative, and no distress Resp: clear to auscultation bilaterally Cardio: regular rate and rhythm, S1, S2 normal, no murmur, click, rub or gallop GI: soft, non-tender; bowel sounds normal; no masses,  no organomegaly Extremities: Left lower extremity is externally rotated. No edema. Neurologic: No focal deficits  Lab Results:  Data Reviewed: I have personally reviewed following labs and imaging studies  CBC:  Recent  Labs Lab 08/28/16 2110 08/29/16 0618 08/30/16 0205  WBC 13.4* 11.1* 8.3  HGB 13.3 12.3 12.0  HCT 39.5 37.4 36.8  MCV 86.2 87.0 89.3  PLT 330 293 264    Basic Metabolic Panel:  Recent Labs Lab 08/28/16 2110 08/29/16 0618 08/30/16 0205  NA 139 138 138  K 3.9 3.3* 3.7  CL 111 104 105  CO2 23 24 27   GLUCOSE 107* 102* 96  BUN 13 8 5*  CREATININE 0.58 0.59 0.56  CALCIUM 8.8* 8.6* 8.4*  MG  --   --  1.6*    GFR: Estimated Creatinine Clearance (by C-G formula based on SCr of 0.56 mg/dL) Female: 81.1 mL/min Female: 96.1 mL/min  Liver Function Tests:  Recent Labs Lab 08/29/16 0618  ALBUMIN 3.2*    Coagulation Profile:  Recent Labs Lab 08/28/16 2110  INR 0.94    Radiology Studies: Ct Hip Left Wo Contrast  Result Date: 08/28/2016 CLINICAL DATA:  Pt brought from home via EMS for fall off skateboard. Pt states she was riding and 'did a split and felt something pop in left hip'. No obvious deformity in L hip, limited ROM & severe pain w/movement, full sensation & strength L foot, +2 pulses. EXAM: CT OF THE LEFT HIP WITHOUT CONTRAST TECHNIQUE: Multidetector CT imaging of the left hip was performed according  to the standard protocol. Multiplanar CT image reconstructions were also generated. COMPARISON:  None. FINDINGS: There is mildly comminuted and displaced fracture of the left femoral neck. This extends from the superior femoral head neck junction to the inferior mid cervical region. The fracture is mildly displaced, by 9 mm, with the distal fracture component retracting superiorly. No significant fracture angulation. The left hip joint is normally spaced and aligned. There are no other fractures. Mild edema is seen in the directly adjacent left hip deep soft tissues. There is a small hemarthrosis. Visible structures within the pelvis are unremarkable. Surrounding muscular structures are unremarkable. The tendons are intact. IMPRESSION: 1. Mildly comminuted and displaced  fracture of the left femoral neck as described. Electronically Signed   By: Amie Portlandavid  Ormond M.D.   On: 08/28/2016 23:03   Dg Chest Portable 1 View  Result Date: 08/28/2016 CLINICAL DATA:  Fall off skateboard EXAM: PORTABLE CHEST 1 VIEW COMPARISON:  None. FINDINGS: The heart size and mediastinal contours are within normal limits. Both lungs are clear. The visualized skeletal structures are unremarkable. IMPRESSION: No active disease. Electronically Signed   By: Jasmine PangKim  Fujinaga M.D.   On: 08/28/2016 21:30   Dg Hip Unilat With Pelvis 2-3 Views Left  Result Date: 08/28/2016 CLINICAL DATA:  Fall off a skateboard EXAM: DG HIP (WITH OR WITHOUT PELVIS) 2-3V LEFT COMPARISON:  None. FINDINGS: The SI joints are symmetric bilaterally. Pubic symphysis and rami appear intact. The right femoral head projects in joint. The left femoral head projects in joint. There is step-off deformity at the left femoral head neck junction. IMPRESSION: 1. Mild step-off deformity at the left femoral head neck junction suspicious for a fracture. Suggest CT for further evaluation. 2. Right hip within normal limits Electronically Signed   By: Jasmine PangKim  Fujinaga M.D.   On: 08/28/2016 21:30     Medications:  Scheduled: . bupivacaine liposome  20 mL Infiltration Once  . povidone-iodine  2 application Topical Once  . tranexamic acid  1,000 mg Intravenous To OR  . tranexamic acid (CYKLOKAPRON) topical -INTRAOP  2,000 mg Topical Once   Continuous:  WUJ:[id number]:HYDROmorphone (DILAUDID) injection, methocarbamol **OR** methocarbamol (ROBAXIN)  IV, ondansetron (ZOFRAN) IV  Assessment/Plan:  Active Problems:   Closed left hip fracture (HCC)   Leukocytosis   Hip fracture (HCC)    Left hip fracture as a result of mechanical fall Orthopedics has been consulted. Management deferred to them. Can proceed to surgery without any cardiac testing. Continue Dilaudid for pain control.  Leukocytosis Patient is afebrile. Likely reactive due to acute  stress. Normal this morning.  Hypokalemia Repleted.  DVT Prophylaxis: Lovenox    Code Status: Full code  Family Communication: Discussed with the patient  Disposition Plan: Management per orthopedics.    LOS: 1 day   [facility],Raksha Wolfgang  [facility] Pager [phone] 08/30/2016, 7:39 AM  If 7PM-7AM, please contact night-coverage at [email or web address], password [email or web address]

## 2016-08-30 NOTE — Progress Notes (Addendum)
C/o pain to left hip. Dr Noreene LarssonJoslin called and informed new orders noted.  Place on tele to monitor.

## 2016-08-30 NOTE — Plan of Care (Signed)
Problem: Safety: Goal: Ability to remain free from injury will improve Outcome: Progressing Safety precautions maintained  Problem: Pain Managment: Goal: General experience of comfort will improve Outcome: Progressing Medicated twice for pain with moderate relief  Problem: Activity: Goal: Risk for activity intolerance will decrease Outcome: Not Progressing Refusing to be turned and assessed due to pain  Problem: Bowel/Gastric: Goal: Will not experience complications related to bowel motility Outcome: Progressing Denies gastric and bowel issues

## 2016-08-30 NOTE — Progress Notes (Signed)
Answering service called and [facility] Called due to patient stating she cannot take oxy because it makes her have bad dreams and loopy. Pt does not want to take oxy.

## 2016-08-30 NOTE — Discharge Instructions (Addendum)
INSTRUCTIONS AFTER JOINT REPLACEMENT  ° °o Remove items at home which could result in a fall. This includes throw rugs or furniture in walking pathways °o ICE to the affected joint every three hours while awake for 30 minutes at a time, for at least the first 3-5 days, and then as needed for pain and swelling.  Continue to use ice for pain and swelling. You may notice swelling that will progress down to the foot and ankle.  This is normal after surgery.  Elevate your leg when you are not up walking on it.   °o Continue to use the breathing machine you got in the hospital (incentive spirometer) which will help keep your temperature down.  It is common for your temperature to cycle up and down following surgery, especially at night when you are not up moving around and exerting yourself.  The breathing machine keeps your lungs expanded and your temperature down. ° ° °DIET:  As you were doing prior to hospitalization, we recommend a well-balanced diet. ° °DRESSING / WOUND CARE / SHOWERING ° °Keep the surgical dressing until follow up.  The dressing is water proof, so you can shower without any extra covering.  IF THE DRESSING FALLS OFF or the wound gets wet inside, change the dressing with sterile gauze.  Please use good hand washing techniques before changing the dressing.  Do not use any lotions or creams on the incision until instructed by your surgeon.   ° °ACTIVITY ° °o Increase activity slowly as tolerated, but follow the weight bearing instructions below.   °o No driving for 6 weeks or until further direction given by your physician.  You cannot drive while taking narcotics.  °o No lifting or carrying greater than 10 lbs. until further directed by your surgeon. °o Avoid periods of inactivity such as sitting longer than an hour when not asleep. This helps prevent blood clots.  °o You may return to work once you are authorized by your doctor.  ° ° ° °WEIGHT BEARING  ° °Weight bearing as tolerated with assist  device (walker, cane, etc) as directed, use it as long as suggested by your surgeon or therapist, typically at least 4-6 weeks. ° ° °EXERCISES ° °Results after joint replacement surgery are often greatly improved when you follow the exercise, range of motion and muscle strengthening exercises prescribed by your doctor. Safety measures are also important to protect the joint from further injury. Any time any of these exercises cause you to have increased pain or swelling, decrease what you are doing until you are comfortable again and then slowly increase them. If you have problems or questions, call your caregiver or physical therapist for advice.  ° °Rehabilitation is important following a joint replacement. After just a few days of immobilization, the muscles of the leg can become weakened and shrink (atrophy).  These exercises are designed to build up the tone and strength of the thigh and leg muscles and to improve motion. Often times heat used for twenty to thirty minutes before working out will loosen up your tissues and help with improving the range of motion but do not use heat for the first two weeks following surgery (sometimes heat can increase post-operative swelling).  ° °These exercises can be done on a training (exercise) mat, on the floor, on a table or on a bed. Use whatever works the best and is most comfortable for you.    Use music or television while you are exercising so that   the exercises are a pleasant break in your day. This will make your life better with the exercises acting as a break in your routine that you can look forward to.   Perform all exercises about fifteen times, three times per day or as directed.  You should exercise both the operative leg and the other leg as well. ° °Exercises include: °  °• Quad Sets - Tighten up the muscle on the front of the thigh (Quad) and hold for 5-10 seconds.   °• Straight Leg Raises - With your knee straight (if you were given a brace, keep it on),  lift the leg to 60 degrees, hold for 3 seconds, and slowly lower the leg.  Perform this exercise against resistance later as your leg gets stronger.  °• Leg Slides: Lying on your back, slowly slide your foot toward your buttocks, bending your knee up off the floor (only go as far as is comfortable). Then slowly slide your foot back down until your leg is flat on the floor again.  °• Angel Wings: Lying on your back spread your legs to the side as far apart as you can without causing discomfort.  °• Hamstring Strength:  Lying on your back, push your heel against the floor with your leg straight by tightening up the muscles of your buttocks.  Repeat, but this time bend your knee to a comfortable angle, and push your heel against the floor.  You may put a pillow under the heel to make it more comfortable if necessary.  ° °A rehabilitation program following joint replacement surgery can speed recovery and prevent re-injury in the future due to weakened muscles. Contact your doctor or a physical therapist for more information on knee rehabilitation.  ° ° °CONSTIPATION ° °Constipation is defined medically as fewer than three stools per week and severe constipation as less than one stool per week.  Even if you have a regular bowel pattern at home, your normal regimen is likely to be disrupted due to multiple reasons following surgery.  Combination of anesthesia, postoperative narcotics, change in appetite and fluid intake all can affect your bowels.  ° °YOU MUST use at least one of the following options; they are listed in order of increasing strength to get the job done.  They are all available over the counter, and you may need to use some, POSSIBLY even all of these options:   ° °Drink plenty of fluids (prune juice may be helpful) and high fiber foods °Colace 100 mg by mouth twice a day  °Senokot for constipation as directed and as needed Dulcolax (bisacodyl), take with full glass of water  °Miralax (polyethylene glycol)  once or twice a day as needed. ° °If you have tried all these things and are unable to have a bowel movement in the first 3-4 days after surgery call either your surgeon or your primary doctor.   ° °If you experience loose stools or diarrhea, hold the medications until you stool forms back up.  If your symptoms do not get better within 1 week or if they get worse, check with your doctor.  If you experience "the worst abdominal pain ever" or develop nausea or vomiting, please contact the office immediately for further recommendations for treatment. ° ° °ITCHING:  If you experience itching with your medications, try taking only a single pain pill, or even half a pain pill at a time.  You can also use Benadryl over the counter for itching or also to   help with sleep.  ° °TED HOSE STOCKINGS:  Use stockings on both legs until for at least 2 weeks or as directed by physician office. They may be removed at night for sleeping. ° °MEDICATIONS:  See your medication summary on the “After Visit Summary” that nursing will review with you.  You may have some home medications which will be placed on hold until you complete the course of blood thinner medication.  It is important for you to complete the blood thinner medication as prescribed. ° °PRECAUTIONS:  If you experience chest pain or shortness of breath - call 911 immediately for transfer to the hospital emergency department.  ° °If you develop a fever greater that 101 F, purulent drainage from wound, increased redness or drainage from wound, foul odor from the wound/dressing, or calf pain - CONTACT YOUR SURGEON.   °                                                °FOLLOW-UP APPOINTMENTS:  If you do not already have a post-op appointment, please call the office for an appointment to be seen by your surgeon.  Guidelines for how soon to be seen are listed in your “After Visit Summary”, but are typically between 1-4 weeks after surgery. ° °OTHER INSTRUCTIONS:  ° °Knee  Replacement:  Do not place pillow under knee, focus on keeping the knee straight while resting. CPM instructions: 0-90 degrees, 2 hours in the morning, 2 hours in the afternoon, and 2 hours in the evening. Place foam block, curve side up under heel at all times except when in CPM or when walking.  DO NOT modify, tear, cut, or change the foam block in any way. ° °MAKE SURE YOU:  °• Understand these instructions.  °• Get help right away if you are not doing well or get worse.  ° ° °Thank you for letting us be a part of your medical care team.  It is a privilege we respect greatly.  We hope these instructions will help you stay on track for a fast and full recovery!  ° ° °Information on my medicine - ELIQUIS® (apixaban) ° °Why was Eliquis® prescribed for you? °Eliquis® was prescribed for you to reduce the risk of blood clots forming after orthopedic surgery.   ° °What do You need to know about Eliquis®? °Take your Eliquis® TWICE DAILY - one tablet in the morning and one tablet in the evening with or without food.  It would be best to take the dose about the same time each day. ° °If you have difficulty swallowing the tablet whole please discuss with your pharmacist how to take the medication safely. ° °Take Eliquis® exactly as prescribed by your doctor and DO NOT stop taking Eliquis® without talking to the doctor who prescribed the medication.  Stopping without other medication to take the place of Eliquis® may increase your risk of developing a clot. ° °After discharge, you should have regular check-up appointments with your healthcare provider that is prescribing your Eliquis®. ° °What do you do if you miss a dose? °If a dose of ELIQUIS® is not taken at the scheduled time, take it as soon as possible on the same day and twice-daily administration should be resumed.  The dose should not be doubled to make up for a missed dose.  Do not take more   than one tablet of ELIQUIS at the same time. ° °Important Safety  Information °A possible side effect of Eliquis® is bleeding. You should call your healthcare provider right away if you experience any of the following: °? Bleeding from an injury or your nose that does not stop. °? Unusual colored urine (red or dark brown) or unusual colored stools (red or black). °? Unusual bruising for unknown reasons. °? A serious fall or if you hit your head (even if there is no bleeding). ° °Some medicines may interact with Eliquis® and might increase your risk of bleeding or clotting while on Eliquis®. To help avoid this, consult your healthcare provider or pharmacist prior to using any new prescription or non-prescription medications, including herbals, vitamins, non-steroidal anti-inflammatory drugs (NSAIDs) and supplements. ° °This website has more information on Eliquis® (apixaban): http://www.eliquis.com/eliquis/home ° °

## 2016-08-30 NOTE — Anesthesia Preprocedure Evaluation (Addendum)

## 2016-08-31 ENCOUNTER — Encounter (HOSPITAL_COMMUNITY): Payer: Self-pay | Admitting: Orthopedic Surgery

## 2016-08-31 DIAGNOSIS — S72002A Fracture of unspecified part of neck of left femur, initial encounter for closed fracture: Principal | ICD-10-CM

## 2016-08-31 DIAGNOSIS — D649 Anemia, unspecified: Secondary | ICD-10-CM

## 2016-08-31 LAB — CBC
HCT: 24 % — ABNORMAL LOW (ref 36.0–46.0)
Hemoglobin: 8 g/dL — ABNORMAL LOW (ref 12.0–15.0)
MCH: 28.8 pg (ref 26.0–34.0)
MCHC: 33.3 g/dL (ref 30.0–36.0)
MCV: 86.3 fL (ref 78.0–100.0)
PLATELETS: 240 10*3/uL (ref 150–400)
RBC: 2.78 MIL/uL — AB (ref 3.87–5.11)
RDW: 12.7 % (ref 11.5–15.5)
WBC: 8.1 10*3/uL (ref 4.0–10.5)

## 2016-08-31 MED ORDER — SENNA 8.6 MG PO TABS
1.0000 | ORAL_TABLET | Freq: Every day | ORAL | Status: DC
  Administered 2016-09-01: 8.6 mg via ORAL
  Filled 2016-08-31 (×2): qty 1

## 2016-08-31 MED ORDER — IBUPROFEN 200 MG PO TABS: 400.0000 mg | ORAL_TABLET | Freq: Three times a day (TID) | ORAL | Status: DC | PRN

## 2016-08-31 MED ORDER — APIXABAN 2.5 MG PO TABS
2.5000 mg | ORAL_TABLET | Freq: Two times a day (BID) | ORAL | Status: DC
  Administered 2016-08-31 – 2016-09-01 (×3): 2.5 mg via ORAL
  Filled 2016-08-31 (×3): qty 1

## 2016-08-31 NOTE — Progress Notes (Addendum)
PATIENT ID: Lori Flores H Castles  MRN: [id number]  DOB/AGE:  October 28, 1965 / 51 y.o.  1 Day Post-Op Anterior left total up arthroplasty    PROGRESS NOTE Subjective: Patient is alert, oriented, no Nausea, no Vomiting, yes passing gas, . Taking PO well. Denies SOB, Chest or Calf Pain. Using Incentive Spirometer, PAS in place. Ambulate WBAT, sat up last nite Patient reports pain as  5/10  .    Objective: Vital signs in last 24 hours: Vitals:   08/30/16 1813 08/30/16 2115 08/31/16 0027 08/31/16 0514  BP: 97/67 (!) 95/53 (!) 92/46 (!) 107/59  Pulse: 92 (!) 103 96 97  Resp: 14 16 16 14   Temp: 98.3 F (36.8 C) (!) 100.7 F (38.2 C) 98.7 F (37.1 C) 99 F (37.2 C)  TempSrc: Oral Oral Oral Oral  SpO2: 95% 93% 97% 98%  Weight:      Height:          Intake/Output from previous day: I/O last 3 completed shifts: In: 2000 [I.V.:2000] Out: 1100 [Urine:350; Blood:750]   Intake/Output this shift: No intake/output data recorded.   LABORATORY DATA:  Recent Labs  08/28/16 2110 08/29/16 0618 08/30/16 0205  WBC 13.4* 11.1* 8.3  HGB 13.3 12.3 12.0  HCT 39.5 37.4 36.8  PLT 330 293 264  NA 139 138 138  K 3.9 3.3* 3.7  CL 111 104 105  CO2 23 24 27   BUN 13 8 5*  CREATININE 0.58 0.59 0.56  GLUCOSE 107* 102* 96  INR 0.94  --   --   CALCIUM 8.8* 8.6* 8.4*    Examination: Neurologically intact ABD soft Neurovascular intact Sensation intact distally Intact pulses distally Dorsiflexion/Plantar flexion intact Incision: dressing C/D/I No cellulitis present Compartment soft} XR AP&Lat of hip shows well placed\fixed THA  Assessment:   1 Day Post-Op Anterior left total hip arthroplasty ADDITIONAL DIAGNOSIS:  Expected Acute Blood Loss Anemia, Adult ADHD  Plan: PT/OT WBAT, THA  DVT Prophylaxis: SCDx72 hrs, ASA 325 mg BID x 2 weeks  DISCHARGE PLAN: Home  DISCHARGE NEEDS: HHPT, Walker and 3-in-1 comode seat

## 2016-08-31 NOTE — Anesthesia Postprocedure Evaluation (Addendum)
Anesthesia Post Note  Patient: Charise Killianngle H Colwell  Procedure(s) Performed: Procedure(s) (LRB): ANTERIOR APPROACH HEMI HIP ARTHROPLASTY (Left)  Patient location during evaluation: PACU Anesthesia Type: General Level of consciousness: awake and alert Pain management: pain level controlled Vital Signs Assessment: post-procedure vital signs reviewed and stable Respiratory status: spontaneous breathing, nonlabored ventilation, respiratory function stable and patient connected to nasal cannula oxygen Cardiovascular status: blood pressure returned to baseline and stable Postop Assessment: no signs of nausea or vomiting Anesthetic complications: no       Last Vitals:  Vitals:   08/31/16 0514 08/31/16 1358  BP: (!) 107/59 (!) 100/53  Pulse: 97 84  Resp: 14 15  Temp: 37.2 C 36.4 C    Last Pain:  Vitals:   08/31/16 1627  TempSrc:   PainSc: Asleep                 Daphne Karrer

## 2016-08-31 NOTE — Progress Notes (Signed)
Occupational Therapy Evaluation Patient Details Name: Lori Flores MRN: [id number] DOB: September 18, 1965 Today's Date: 08/31/2016    History of Present Illness pt is a [age] yo female with L hip fx from fall skateboarding. s/p 08/30/16 direct ant L THA PMH includes a previous fall the skateboard resulting in an elbow fx.   Clinical Impression   PTA, pt independent with ADL and mobility. Pt making steady improvement although complaining of "feeling lightheaded" during mobility. Began education regarding compensstory strategies for LB ADL and safe functional mobility. Will follow up tomorrow to complete education/address established goals as tolerated. Pt will be appropriate to DC home with intermittent S when medically stable.     Follow Up Recommendations  No OT follow up;Supervision - Intermittent    Equipment Recommendations  3 in 1 bedside commode    Recommendations for Other Services       Precautions / Restrictions Precautions Precautions: None Restrictions Weight Bearing Restrictions: Yes LLE Weight Bearing: Weight bearing as tolerated      Mobility Bed Mobility   General bed mobility comments: OOB in chair  Transfers Overall transfer level: Needs assistance Equipment used: Rolling walker (2 wheeled) Transfers: Sit to/from Stand Sit to Stand: Supervision         General transfer comment: vc for hand placement and weightbearing through L LE    Balance Overall balance assessment: Needs assistance (rw support in standing) Sitting-balance support: Feet supported Sitting balance-Leahy Scale: Fair     Standing balance support: Bilateral upper extremity supported;During functional activity Standing balance-Leahy Scale: Fair Standing balance comment: RW for UE support                            ADL Overall ADL's : Needs assistance/impaired     Grooming: Set up;Sitting   Upper Body Bathing: Set up;Sitting   Lower Body Bathing: Minimal assistance;Sit  to/from stand   Upper Body Dressing : Set up;Sitting   Lower Body Dressing: Minimal assistance;Sit to/from stand   Toilet Transfer: Ambulation;RW;Comfort height toilet;Min guard   Toileting- Clothing Manipulation and Hygiene: Supervision/safety;Sit to/from stand       Functional mobility during ADLs: Rolling walker;Min guard General ADL Comments: Pt has difficulty reaching her L foot. Complaints of feeling "lightheaded"     Vision         Perception     Praxis      Pertinent Vitals/Pain Pain Assessment: 0-10 Pain Score: 4  Pain Location: L hip Pain Descriptors / Indicators: Operative site guarding;Aching;Tender;Throbbing;Discomfort Pain Intervention(s): Limited activity within patient's tolerance     Hand Dominance Right   Extremity/Trunk Assessment Upper Extremity Assessment Upper Extremity Assessment: Overall WFL for tasks assessed   Lower Extremity Assessment Lower Extremity Assessment: Defer to PT evaluation LLE Deficits / Details: limited mobility of L LE secondary to pain LLE: Unable to fully assess due to pain   Cervical / Trunk Assessment Cervical / Trunk Assessment: Normal   Communication Communication Communication: No difficulties   Cognition Arousal/Alertness: Awake/alert Behavior During Therapy: WFL for tasks assessed/performed Overall Cognitive Status: Within Functional Limits for tasks assessed                     General Comments       Exercises       Shoulder Instructions      Home Living Family/patient expects to be discharged to:: Private residence Living Arrangements: Spouse/significant other;Children Available Help at Discharge: Family; 24/7 Type of  Home: House Home Access: Stairs to enter [employer] of Steps: 2 Entrance Stairs-Rails: None (wide stairs can accomodate walker) Home Layout: Two level;Able to live on main level with bedroom/bathroom Alternate Level Stairs-Number of Steps: 20 Alternate Level  Stairs-Rails: Right Bathroom Shower/Tub: Tub/shower unit;Curtain Shower/tub characteristics: Engineer, building services: Standard Bathroom Accessibility: Yes How Accessible: Accessible via walker Home Equipment: None          Prior Functioning/Environment Level of Independence: Independent                 OT Problem List: Decreased strength;Decreased range of motion;Decreased activity tolerance;Impaired balance (sitting and/or standing);Decreased safety awareness;Decreased knowledge of use of DME or AE;Pain      OT Treatment/Interventions: Self-care/ADL training;DME and/or AE instruction;Therapeutic activities;Patient/family education    OT Goals(Current goals can be found in the care plan section) Acute Rehab OT Goals Patient Stated Goal: to go home  OT Goal Formulation: With patient Time For Goal Achievement: 09/14/16 Potential to Achieve Goals: Good ADL Goals Pt Will Perform Lower Body Bathing: with supervision;with adaptive equipment;sit to/from stand Pt Will Perform Lower Body Dressing: with supervision;with adaptive equipment;sit to/from stand Pt Will Perform Tub/Shower Transfer: ambulating;3 in 1;with min guard assist;Tub transfer;rolling walker;with caregiver independent in assisting  OT Frequency: Min 2X/week   Barriers to D/C:            Co-evaluation              End of Session Equipment Utilized During Treatment: Gait belt;Rolling walker Nurse Communication: Mobility status  Activity Tolerance: Patient tolerated treatment well Patient left: in chair;with call bell/phone within reach  OT Visit Diagnosis: Other abnormalities of gait and mobility (R26.89);Pain Pain - Right/Left: Left Pain - part of body: Hip                ADL either performed or assessed with clinical judgement  Time: 1055-1130 OT Time Calculation (min): 35 min Charges:  OT General Charges $OT Visit: 1 Procedure OT Evaluation $OT Eval Low Complexity: 1 Procedure OT  Treatments $Self Care/Home Management : 8-22 mins G-Codes: OT G-codes **NOT FOR INPATIENT CLASS** Functional Assessment Tool Used: Clinical judgement Functional Limitation: Self care Self Care Current Status (Z6109): At least 1 percent but less than 20 percent impaired, limited or restricted Self Care Goal Status (U0454): At least 1 percent but less than 20 percent impaired, limited or restricted   [facility], OT/L  [phone] 08/31/2016  Rockey Guarino,HILLARY 08/31/2016, 1:42 PM

## 2016-08-31 NOTE — Progress Notes (Signed)
Physical Therapy Treatment Patient Details Name: Lori Flores H Kirkey MRN: [id number] DOB: 1966-03-19 Today's Date: 08/31/2016    History of Present Illness pt is a 51 yo female with L hip fx from fall skateboarding. s/p 08/30/16 direct ant L THA     PT Comments    Pt admitted with above diagnosis. Pt currently with functional limitations due to pain and lethargy possibly due to medication. Pt very painful with AAROM L heel slides and L straight leg raises. Pt encouraged to try exercises again this evening when she is feeling a little better.  Pt will benefit from skilled PT to increase their independence and safety with mobility to allow discharge to the venue listed below.      Follow Up Recommendations  Home health PT;Supervision/Assistance - 24 hour     Equipment Recommendations  3in1 (PT);Rolling walker with 5" wheels    Recommendations for Other Services       Precautions / Restrictions Precautions Precautions: None Restrictions Weight Bearing Restrictions: Yes LLE Weight Bearing: Weight bearing as tolerated    Mobility  Bed Mobility Overal bed mobility: Needs Assistance Bed Mobility: Supine to Sit     Supine to sit:  (for L LE management to to floor)     General bed mobility comments: pt in recliner and awoke at entry  Transfers Overall transfer level: Needs assistance Equipment used: Rolling walker (2 wheeled) Transfers: Sit to/from Stand Sit to Stand: Supervision         General transfer comment: vc for hand placement and weightbearing through L LE      Balance Overall balance assessment:  (rw support in standing) Sitting-balance support: Feet supported Sitting balance-Leahy Scale: Fair     Standing balance support: Bilateral upper extremity supported;During functional activity Standing balance-Leahy Scale: Fair Standing balance comment: RW for UE support                    Cognition Arousal/Alertness: Lethargic;Suspect due to  medications Behavior During Therapy: Flat affect Overall Cognitive Status: Within Functional Limits for tasks assessed                      Exercises Total Joint Exercises Ankle Circles/Pumps: Right;Left;AROM;Supine;10 reps Quad Sets: AROM;10 reps;Both;Supine Heel Slides: 5 reps;Left;AROM;AAROM;Supine (5 AAROM on L, 10 AROM on R) Straight Leg Raises: AROM;AAROM;Right;Left;5 reps;10 reps;Supine (5 AAROM on L, 10 AROM on R)    General Comments        Pertinent Vitals/Pain Pain Assessment: Faces Faces Pain Scale: Hurts whole lot (with L knee and hip movement) Pain Location: L hip Pain Descriptors / Indicators: Grimacing;Guarding;Operative site guarding;Aching;Tender;Throbbing;Sharp        SaO2 on RA 96% at rest and 94% with supine therex       HR 97 bpm at rest and 115 bpm with therex  Home Living Family/patient expects to be discharged to:: Private residence Living Arrangements: Spouse/significant other;Children Available Help at Discharge: Family Type of Home: House Home Access: Stairs to enter Entrance Stairs-Rails: None (wide stairs can accomodate walker) Home Layout: Two level;Able to live on main level with bedroom/bathroom Home Equipment: None      Prior Function Level of Independence: Independent          PT Goals (current goals can now be found in the care plan section) Acute Rehab PT Goals Patient Stated Goal: to go home  PT Goal Formulation: With patient Time For Goal Achievement: 09/07/16 Potential to Achieve Goals: Good Progress towards PT  goals: Progressing toward goals    Frequency    7X/week      PT Plan Current plan remains appropriate    Co-evaluation             End of Session   Activity Tolerance: Patient limited by pain;Patient limited by lethargy Patient left: in chair;with call bell/phone within reach Nurse Communication: Mobility status PT Visit Diagnosis: Other abnormalities of gait and mobility (R26.89);Pain Pain -  Right/Left: Left Pain - part of body: Hip     Time: 1610-9604 PT Time Calculation (min) (ACUTE ONLY): 16 min  Charges:  $Therapeutic Exercise: 8-22 mins                    G Codes:       Elon Alas Fleet 08/31/2016, 3:13 PM  Courtney Paris. Beverely Risen PT, DPT Acute Rehabilitation  [phone]

## 2016-08-31 NOTE — Progress Notes (Signed)
[facility] PROGRESS NOTE  Lori Flores ZOX:[id number] DOB: 13-Feb-1966 DOA: 08/28/2016  PCP: Default, Provider, MD  Brief History/Interval Summary: 51 year old Caucasian female with no significant past medical history who had a radial head fracture recently, which was treated with a splint and has fully recovered. The day prior to this hospitalization she was on a motorized skateboard and she fell off of it. She heard a popping sound in her left hip and was found to have a fracture. Hospitalized for further management.  Reason for Visit: Left hip fracture  Consultants: Orthopedics  Procedures: 2/26: Anterior L total hip arthroplasty   Antibiotics: None  Subjective/Interval History: Patient feels better. Continues to have some pain in the left hip, although improved from before. No issues after surgery. Denies any nausea, vomiting.   ROS: Denies headaches. Denies any shortness of breath, nausea, vomiting.  Objective:  Vital Signs  Vitals:   08/30/16 1813 08/30/16 2115 08/31/16 0027 08/31/16 0514  BP: 97/67 (!) 95/53 (!) 92/46 (!) 107/59  Pulse: 92 (!) 103 96 97  Resp: 14 16 16 14   Temp: 98.3 F (36.8 C) (!) 100.7 F (38.2 C) 98.7 F (37.1 C) 99 F (37.2 C)  TempSrc: Oral Oral Oral Oral  SpO2: 95% 93% 97% 98%  Weight:      Height:        Intake/Output Summary (Last 24 hours) at 08/31/16 1142 Last data filed at 08/31/16 1000  Gross per 24 hour  Intake             2480 ml  Output             1100 ml  Net             1380 ml   Filed Weights   08/28/16 2002  Weight: 68 kg (150 lb)    General appearance: alert, cooperative, and no distress Resp: clear to auscultation bilaterally Cardio: regular rate and rhythm, S1, S2 normal, no murmur, click, rub or gallop GI: soft, non-tender; bowel sounds normal; no masses,  no organomegaly Extremities: Mild swelling left thigh area due to recent surgery. Neurologic: No focal deficits  Lab Results:  Data Reviewed:  I have personally reviewed following labs and imaging studies  CBC:  Recent Labs Lab 08/28/16 2110 08/29/16 0618 08/30/16 0205 08/31/16 0854  WBC 13.4* 11.1* 8.3 8.1  HGB 13.3 12.3 12.0 8.0*  HCT 39.5 37.4 36.8 24.0*  MCV 86.2 87.0 89.3 86.3  PLT 330 293 264 240    Basic Metabolic Panel:  Recent Labs Lab 08/28/16 2110 08/29/16 0618 08/30/16 0205  NA 139 138 138  K 3.9 3.3* 3.7  CL 111 104 105  CO2 23 24 27   GLUCOSE 107* 102* 96  BUN 13 8 5*  CREATININE 0.58 0.59 0.56  CALCIUM 8.8* 8.6* 8.4*  MG  --   --  1.6*    GFR: Estimated Creatinine Clearance (by C-G formula based on SCr of 0.56 mg/dL) Female: 81.175.7 mL/min Female: 96.1 mL/min  Liver Function Tests:  Recent Labs Lab 08/29/16 0618  ALBUMIN 3.2*    Coagulation Profile:  Recent Labs Lab 08/28/16 2110  INR 0.94    Radiology Studies: Dg C-arm 1-60 Min  Result Date: 08/30/2016 CLINICAL DATA:  Left total hip arthroplasty. EXAM: DG C-ARM 61-120 MIN; OPERATIVE LEFT HIP WITH PELVIS COMPARISON:  CT scan 08/28/2016 FINDINGS: Femoral and acetabular components are well seated without complicating features. The visualized bony pelvis is intact. IMPRESSION: Well seated components of a  total left hip arthroplasty. Electronically Signed   By: Rudie Meyer M.D.   On: 08/30/2016 16:07   Dg Hip Operative Unilat With Pelvis Left  Result Date: 08/30/2016 CLINICAL DATA:  Left total hip arthroplasty. EXAM: DG C-ARM 61-120 MIN; OPERATIVE LEFT HIP WITH PELVIS COMPARISON:  CT scan 08/28/2016 FINDINGS: Femoral and acetabular components are well seated without complicating features. The visualized bony pelvis is intact. 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 Electronically Signed   By: Rudie Meyer M.D.   On: 08/30/2016 16:07     Medications:  Scheduled: . aspirin EC  325 mg Oral Q breakfast  . celecoxib  200 mg Oral Q12H  . docusate sodium  100 mg Oral BID  . gabapentin  300 mg Oral TID  .  senna  1 tablet Oral Daily   Continuous:  ZOX:[id number] **OR** acetaminophen, aluminum hydroxide, bisacodyl, diphenhydrAMINE, HYDROcodone-acetaminophen, HYDROmorphone (DILAUDID) injection, ibuprofen, menthol-cetylpyridinium **OR** phenol, methocarbamol, metoCLOPramide **OR** metoCLOPramide (REGLAN) injection, ondansetron **OR** ondansetron (ZOFRAN) IV, polyethylene glycol, sodium phosphate  Assessment/Plan:  Active Problems:   Closed left hip fracture (HCC)   Leukocytosis   Hip fracture (HCC)    Left hip fracture as a result of mechanical fall Patient is status post surgery. Doing well except for some pain issues. Orthopedics is following. PT evaluation.  Anemia, most likely due to operative loss/acute blood loss anemia Significant drop in hemoglobin is noted. Probably related to surgery. No other overt bleeding noted. Repeat tomorrow.  Leukocytosis Patient's WBC is normal. She did have low-grade fever yesterday. Possibly due to surgery and stress of acute injury. Continue to monitor. No clear indication to initiate antibiotics. She denies any dysuria.   Vitamin D deficiency. Vitamin D level was 19.4. Will benefit from vitamin D repletion. Could be initiated on discharge.  Hypokalemia Repleted. Repeat labs tomorrow.  DVT Prophylaxis: Aspirin per orthopedics Code Status: Full code  Family Communication: Discussed with the patient  Disposition Plan: PT evaluation. Repeat hemoglobin tomorrow. Anticipate that she will be able to go home with home health services when ready for discharge.    LOS: 2 days   [facility]  [facility] Pager [phone] 08/31/2016, 11:42 AM  If 7PM-7AM, please contact night-coverage at [email or web address], password [facility]

## 2016-08-31 NOTE — Progress Notes (Addendum)
Pt refused aspirin this am because it "makes her stomach hurt." Pt was educated on purpose of aspirin for post-op DVT prevention. Will contact Dr. Turner Danielsowan for other orders.  12:20- Received orders for eliquis BID from Dannielle BurnEric Phillips PA.

## 2016-08-31 NOTE — Plan of Care (Signed)
Problem: Pain Managment: Goal: General experience of comfort will improve Outcome: Progressing Medicated twice for pain with moderate relief  Problem: Physical Regulation: Goal: Will remain free from infection Outcome: Progressing No S/S of infection noted  Problem: Activity: Goal: Risk for activity intolerance will decrease Outcome: Progressing Minimum activity tolerance  Problem: Bowel/Gastric: Goal: Will not experience complications related to bowel motility Outcome: Progressing Denies gastric and bowel issues

## 2016-08-31 NOTE — Evaluation (Signed)
Physical Therapy Evaluation Patient Details Name: Lori Flores H Tsui MRN: [id number] DOB: 02/13/1966 Today's Date: 08/31/2016   History of Present Illness  pt is a 51 yo female with L hip fx from fall skateboarding. s/p 08/30/16 direct ant L THA   Clinical Impression  Pt eager to work with PT this am. Pt is min Ax 1 for bed mobility, transfers and gait requiring vc for safety with RW Pt SaO2 dropped to 88% on RA with gait and pt became diaphoretic and pale. SaO2 returned to 96% within 30 return of 1.5L O2 via nasal cannula. Pt requries continued skilled PT for gait, stair and DME training and to increase endurance and strength to safely return to home environment.      Follow Up Recommendations Home health PT;Supervision/Assistance - 24 hour    Equipment Recommendations  3in1 (PT);Rolling walker with 5" wheels    Recommendations for Other Services       Precautions / Restrictions Precautions Precautions: None Restrictions Weight Bearing Restrictions: Yes LLE Weight Bearing: Weight bearing as tolerated      Mobility  Bed Mobility Overal bed mobility: Needs Assistance Bed Mobility: Supine to Sit     Supine to sit: Min assist (for L LE management to to floor)     General bed mobility comments: required vc for hand placement to better facilitate movement  Transfers Overall transfer level: Needs assistance Equipment used: Rolling walker (2 wheeled) Transfers: Sit to/from Stand Sit to Stand: Min assist         General transfer comment: vc for hand placement and weightbearing through L LE  Ambulation/Gait Ambulation/Gait assistance: Min assist Ambulation Distance (Feet): 20 Feet Assistive device: Rolling walker (2 wheeled) Gait Pattern/deviations: Decreased step length - right;Decreased step length - left;Step-to pattern;Shuffle;Ataxic Gait velocity: decreased Gait velocity interpretation: Below normal speed for age/gender General Gait Details: vc for staying inside the  walker and pushing through UE to decreased L LE weight bearing as needed, UE support limited by IV placement  Stairs            Wheelchair Mobility    Modified Rankin (Stroke Patients Only)       Balance Overall balance assessment: Modified Independent (rw support in standing) Sitting-balance support: Feet supported Sitting balance-Leahy Scale: Fair     Standing balance support: Bilateral upper extremity supported;During functional activity Standing balance-Leahy Scale: Poor Standing balance comment: RW for UE support                             Pertinent Vitals/Pain Pain Assessment: 0-10 Pain Score: 4  Pain Location: L hip Pain Descriptors / Indicators: Operative site guarding;Aching;Tender;Throbbing;Discomfort Pain Intervention(s): Limited activity within patient's tolerance  At rest SaO2 on 1.5L O2 96% on RA 94% HR 98 bpm After activity SaO2 on RA dropped to 88% and HR 120 bpm, pt diaphoretic and pale returned to recliner and O2 replaced at 1.5L  SaO@ returned to 94% and HR 105 bpm.    Home Living Family/patient expects to be discharged to:: Private residence Living Arrangements: Spouse/significant other;Children Available Help at Discharge: Family Type of Home: House Home Access: Stairs to enter Entrance Stairs-Rails: None (wide stairs can accomodate walker) Entrance Stairs-Number of Steps: 2 Home Layout: Two level;Able to live on main level with bedroom/bathroom Home Equipment: None      Prior Function Level of Independence: Independent               Hand Dominance  Dominant Hand: Right    Extremity/Trunk Assessment   Upper Extremity Assessment Upper Extremity Assessment: Overall WFL for tasks assessed    Lower Extremity Assessment Lower Extremity Assessment: Defer to PT evaluation LLE Deficits / Details: limited mobility of L LE secondary to pain LLE: Unable to fully assess due to pain    Cervical / Trunk Assessment Cervical  / Trunk Assessment: Normal  Communication   Communication: No difficulties  Cognition Arousal/Alertness: Awake/alert Behavior During Therapy: WFL for tasks assessed/performed Overall Cognitive Status: Within Functional Limits for tasks assessed                      General Comments General comments (skin integrity, edema, etc.): pt on 1.5 L O2 via nasal cannula at entry with 96% SaO2 ambulated on RA and SaO2 dropped to 88% O2 replaced and SaO2 returned to 94%    Exercises     Assessment/Plan    PT Assessment Patient needs continued PT services  PT Problem List Decreased strength;Decreased range of motion;Decreased activity tolerance;Decreased mobility;Pain;Decreased knowledge of use of DME;Decreased safety awareness       PT Treatment Interventions DME instruction;Gait training;Stair training;Therapeutic exercise;Therapeutic activities;Functional mobility training    PT Goals (Current goals can be found in the Care Plan section)  Acute Rehab PT Goals Patient Stated Goal: to go home  PT Goal Formulation: With patient Time For Goal Achievement: 09/07/16 Potential to Achieve Goals: Good    Frequency 7X/week   Barriers to discharge        Co-evaluation               End of Session Equipment Utilized During Treatment: Gait belt;Oxygen Activity Tolerance: Patient limited by fatigue;Patient limited by pain Patient left: in chair;with call bell/phone within reach Nurse Communication: Mobility status (IV finished) PT Visit Diagnosis: Other abnormalities of gait and mobility (R26.89);Pain Pain - Right/Left: Left Pain - part of body: Hip         Time: 0832-0904 PT Time Calculation (min) (ACUTE ONLY): 32 min   Charges:   PT Evaluation $PT Eval Low Complexity: 1 Procedure PT Treatments $Gait Training: 8-22 mins   PT G Codes:         Elon Alas Fleet 08/31/2016, 11:17 AM Courtney Paris. Beverely Risen PT, DPT Acute Rehabilitation  [phone]

## 2016-09-01 DIAGNOSIS — E559 Vitamin D deficiency, unspecified: Secondary | ICD-10-CM | POA: Diagnosis present

## 2016-09-01 DIAGNOSIS — D72829 Elevated white blood cell count, unspecified: Secondary | ICD-10-CM

## 2016-09-01 DIAGNOSIS — D62 Acute posthemorrhagic anemia: Secondary | ICD-10-CM | POA: Diagnosis present

## 2016-09-01 DIAGNOSIS — S72002D Fracture of unspecified part of neck of left femur, subsequent encounter for closed fracture with routine healing: Secondary | ICD-10-CM

## 2016-09-01 LAB — CBC
HEMATOCRIT: 22.5 % — AB (ref 36.0–46.0)
HEMOGLOBIN: 7.4 g/dL — AB (ref 12.0–15.0)
MCH: 28.7 pg (ref 26.0–34.0)
MCHC: 32.9 g/dL (ref 30.0–36.0)
MCV: 87.2 fL (ref 78.0–100.0)
Platelets: 248 10*3/uL (ref 150–400)
RBC: 2.58 MIL/uL — ABNORMAL LOW (ref 3.87–5.11)
RDW: 13 % (ref 11.5–15.5)
WBC: 13.8 10*3/uL — ABNORMAL HIGH (ref 4.0–10.5)

## 2016-09-01 LAB — BASIC METABOLIC PANEL
Anion gap: 4 — ABNORMAL LOW (ref 5–15)
BUN: 8 mg/dL (ref 6–20)
CHLORIDE: 106 mmol/L (ref 101–111)
CO2: 29 mmol/L (ref 22–32)
CREATININE: 0.59 mg/dL (ref 0.44–1.00)
Calcium: 8.5 mg/dL — ABNORMAL LOW (ref 8.9–10.3)
GFR calc non Af Amer: >60 mL/min (ref >60)
GLUCOSE: 146 mg/dL — AB (ref 65–99)
Potassium: 4.2 mmol/L (ref 3.5–5.1)
Sodium: 139 mmol/L (ref 135–145)

## 2016-09-01 MED ORDER — ADULT MULTIVITAMIN W/MINERALS CH: 1.0000 | ORAL_TABLET | Freq: Every day | ORAL | Status: AC

## 2016-09-01 MED ORDER — VITAMIN D-3 125 MCG (5000 UT) PO TABS: 1.0000 | ORAL_TABLET | Freq: Every day | ORAL | 0 refills | Status: AC

## 2016-09-01 NOTE — Evaluation (Signed)
Occupational Therapy Evaluation Patient Details Name: Lori Flores MRN: [id number] DOB: 09-26-1965 Today's Date: 09/01/2016    History of Present Illness pt is a 51 yo female with L hip fx from fall skateboarding. s/p 08/30/16 direct ant L THA    Clinical Impression   Pt has met goals and adequate level for d/c home at this time. Pt will have daughter and niece to (A) 24/7 as needed and spouse in the evenings. Pt advised on safety for transfer with 3n1. Pt states my niece is a PT and she will make sure I do it right.     Follow Up Recommendations  No OT follow up;Supervision - Intermittent    Equipment Recommendations  3 in 1 bedside commode    Recommendations for Other Services       Precautions / Restrictions Precautions Precautions: None      Mobility Bed Mobility                  Transfers                 General transfer comment: up in gym with PT     Balance                                            ADL Overall ADL's : Needs assistance/impaired                                 Tub/ Shower Transfer: Tub transfer;Min guard;3 in 1 Tub/Shower Transfer Details (indicate cue type and reason): pt educated on transfer backing up to seat. pt however chose to face the 3n1 so she could bend R knee over tub surface and then turn and sit on seat. Pt advised to only do this with family present. pt states oh i will Functional mobility during ADLs: Supervision/safety General ADL Comments: pt able to reach bil feet this session with hands. Pt already has reacher at home she can use PRN     Vision         Perception     Praxis      Pertinent Vitals/Pain Pain Assessment: Faces Faces Pain Scale: Hurts a little bit Pain Location: L hip Pain Descriptors / Indicators: Sore Pain Intervention(s): Monitored during session;Premedicated before session     Hand Dominance     Extremity/Trunk Assessment              Communication     Cognition Arousal/Alertness: Awake/alert Behavior During Therapy: WFL for tasks assessed/performed Overall Cognitive Status: Within Functional Limits for tasks assessed                     General Comments       Exercises       Shoulder Instructions      Home Living                                          Prior Functioning/Environment                   OT Problem List:        OT Treatment/Interventions:      OT Goals(Current goals can be found  in the care plan section) Acute Rehab OT Goals Patient Stated Goal: to go home  OT Goal Formulation: With patient Time For Goal Achievement: 09/14/16 Potential to Achieve Goals: Good ADL Goals Pt Will Perform Lower Body Bathing: with supervision;with adaptive equipment;sit to/from stand Pt Will Perform Lower Body Dressing: with supervision;with adaptive equipment;sit to/from stand Pt Will Perform Tub/Shower Transfer: ambulating;3 in 1;with min guard assist;Tub transfer;rolling walker;with caregiver independent in assisting  OT Frequency: Min 2X/week   Barriers to D/C:            Co-evaluation              End of Session Equipment Utilized During Treatment: Gait belt;Rolling walker Nurse Communication: Mobility status  Activity Tolerance: Patient tolerated treatment well Patient left: Other (comment) (ambulation with PT after tub education)  OT Visit Diagnosis: Other abnormalities of gait and mobility (R26.89);Pain Pain - Right/Left: Left Pain - part of body: Hip                ADL either performed or assessed with clinical judgement  Time: 1010-1018 OT Time Calculation (min): 8 min Charges:  OT General Charges $OT Visit: 1 Procedure OT Treatments $Self Care/Home Management : 8-22 mins G-Codes:      Jeri Modena   OTR/L Pager: [phone] Office: [phone] .   Parke Poisson B 09/01/2016, 10:20 AM

## 2016-09-01 NOTE — Discharge Summary (Signed)
Physician Discharge Summary  Lori Flores ZOX:[id number] DOB: 02/15/1966 DOA: 08/28/2016  PCP: Default, Provider, MD  Admit date: 08/28/2016 Discharge date: 09/01/2016  Admitted From: Home Disposition: Home  Recommendations for Outpatient Follow-up:  1. Follow up with PCP in 1-2 weeks 2. Please obtain BMP/CBC in one week  Home Health: PT Equipment/Devices: 3N1 and rolling walker  Discharge Condition: Stable CODE STATUS: Full Code Diet recommendation: Diet regular Room service appropriate? Yes; Fluid consistency: Thin Diet - low sodium heart healthy  Brief/Interim Summary: 51 year old Caucasian female with no significant past medical history who had a radial head fracture recently, which was treated with a splint and has fully recovered. The day prior to this hospitalization she was on a motorized skateboard and she fell off of it. She heard a popping sound in her left hip and was found to have a fracture. Hospitalized for further management.  Discharge Diagnoses:  Active Problems:   Closed left hip fracture (HCC)   Leukocytosis   Hip fracture (HCC)   Left hip fracture as a result of mechanical fall Left hip fracture secondary to mechanical fall, status post left THA none by Dr. Turner Daniels. Seen by PT, pain is controlled by narcotics. Received Eliquis during the hospital stay for DVT prophylaxis. On discharge orthopedics ordered aspirin twice a day for DVT prophylaxis and Vicodin for pain control.  Anemia, most likely due to operative loss/acute blood loss anemia Hemoglobin was 12.3 on admission, dropped to 8.0 after surgery and this morning at 7.4. This is likely perioperative blood loss augmented by hemodilution by IV fluids. Patient appears to be asymptomatic, no evidence of bleeding, discharge home on MVI. Check CBC in 1-2 weeks.  Leukocytosis Likely secondary to stress demargination, no evidence of infection.  Vitamin D deficiency. Vitamin D level was 19.4. Discharge  home on vitamin D3 5000 units daily for 2 month. Also MVI daily, needs her vitamin D to be checked in 6-8 weeks.  Hypokalemia Repleted. Repeat labs tomorrow.  Discharge Instructions  Discharge Instructions    Diet - low sodium heart healthy    Complete by:  As directed    Increase activity slowly    Complete by:  As directed    Weight bearing as tolerated    Complete by:  As directed      Allergies as of 09/01/2016   No Known Allergies     Medication List    TAKE these medications   amphetamine-dextroamphetamine 30 MG tablet Commonly known as:  ADDERALL Take 15-30 mg by mouth 2 (two) times daily. Take 30mg  by mouth at 12 noon and take 15 mg by mouth at 4pm.   aspirin EC 325 MG tablet Take 1 tablet (325 mg total) by mouth 2 (two) times daily.   methocarbamol 500 MG tablet Commonly known as:  ROBAXIN Take 1 tablet (500 mg total) by mouth 2 (two) times daily with a meal.   multivitamin with minerals Tabs tablet Take 1 tablet by mouth daily.   oxyCODONE-acetaminophen 5-325 MG tablet Commonly known as:  ROXICET Take 1-2 tablets by mouth every 4 (four) hours as needed.   Vitamin D-3 5000 units Tabs Take 1 tablet by mouth daily.            Durable Medical Equipment        Start     Ordered   08/30/16 1800  DME Walker rolling  Once    Question:  Patient needs a walker to treat with the following condition  Answer:  Closed left hip fracture (HCC)   08/30/16 1759   08/30/16 1800  DME 3 n 1  Once     08/30/16 1759   08/30/16 1800  DME Bedside commode  Once    Question:  Patient needs a bedside commode to treat with the following condition  Answer:  Closed left hip fracture (HCC)   08/30/16 1759     Follow-up Information    Nestor LewandowskyOWAN,FRANK J, MD In 2 weeks.   Specialty:  Orthopedic Surgery Contact information: [address] [place] [id number] [phone]          No Known Allergies  Consultations: Treatment Team:  Gean BirchwoodFrank Rowan,  MD   Procedures Left THA  Radiological studies: Ct Hip Left Wo Contrast  Result Date: 08/28/2016 CLINICAL DATA:  Pt brought from home via EMS for fall off skateboard. Pt states she was riding and 'did a split and felt something pop in left hip'. No obvious deformity in L hip, limited ROM & severe pain w/movement, full sensation & strength L foot, +2 pulses. EXAM: CT OF THE LEFT HIP WITHOUT CONTRAST TECHNIQUE: Multidetector CT imaging of the left hip was performed according to the standard protocol. Multiplanar CT image reconstructions were also generated. COMPARISON:  None. FINDINGS: There is mildly comminuted and displaced fracture of the left femoral neck. This extends from the superior femoral head neck junction to the inferior mid cervical region. The fracture is mildly displaced, by 9 mm, with the distal fracture component retracting superiorly. No significant fracture angulation. The left hip joint is normally spaced and aligned. There are no other fractures. Mild edema is seen in the directly adjacent left hip deep soft tissues. There is a small hemarthrosis. Visible structures within the pelvis are unremarkable. Surrounding muscular structures are unremarkable. The tendons are intact. IMPRESSION: 1. Mildly comminuted and displaced fracture of the left femoral neck as described. Electronically Signed   By: Amie Portlandavid  Ormond M.D.   On: 08/28/2016 23:03   Dg Chest Portable 1 View  Result Date: 08/28/2016 CLINICAL DATA:  Fall off skateboard EXAM: PORTABLE CHEST 1 VIEW COMPARISON:  None. FINDINGS: The heart size and mediastinal contours are within normal limits. Both lungs are clear. The visualized skeletal structures are unremarkable. IMPRESSION: No active disease. Electronically Signed   By: Jasmine PangKim  Fujinaga M.D.   On: 08/28/2016 21:30   Dg C-arm 1-60 Min  Result Date: 08/30/2016 CLINICAL DATA:  Left total hip arthroplasty. EXAM: DG C-ARM 61-120 MIN; OPERATIVE LEFT HIP WITH PELVIS COMPARISON:  CT scan  08/28/2016 FINDINGS: Femoral and acetabular components are well seated without complicating features. The visualized bony pelvis is intact. 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 Electronically Signed   By: Rudie MeyerP.  Gallerani M.D.   On: 08/30/2016 16:07   Dg Hip Operative Unilat With Pelvis Left  Result Date: 08/30/2016 CLINICAL DATA:  Left total hip arthroplasty. EXAM: DG C-ARM 61-120 MIN; OPERATIVE LEFT HIP WITH PELVIS COMPARISON:  CT scan 08/28/2016 FINDINGS: Femoral and acetabular components are well seated without complicating features. The visualized bony pelvis is intact. 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 Electronically Signed   By: Rudie MeyerP.  Gallerani M.D.   On: 08/30/2016 16:07   Dg Hip Unilat With Pelvis 2-3 Views Left  Result Date: 08/28/2016 CLINICAL DATA:  Fall off a skateboard EXAM: DG HIP (WITH OR WITHOUT PELVIS) 2-3V LEFT COMPARISON:  None. FINDINGS: The SI joints are symmetric bilaterally. Pubic symphysis and rami appear intact. The right femoral  head projects in joint. The left femoral head projects in joint. There is step-off deformity at the left femoral head neck junction. IMPRESSION: 1. Mild step-off deformity at the left femoral head neck junction suspicious for a fracture. Suggest CT for further evaluation. 2. Right hip within normal limits Electronically Signed   By: Jasmine Pang M.D.   On: 08/28/2016 21:30    Subjective:  Discharge Exam: Vitals:   08/31/16 1358 08/31/16 2225 09/01/16 0008 09/01/16 0402  BP: (!) 100/53 113/60 (!) 110/46 (!) 118/54  Pulse: 84 (!) 103 (!) 103 (!) 101  Resp: 15 16 16 18   Temp: 97.6 F (36.4 C) 99.6 F (37.6 C) 99.3 F (37.4 C) 98.9 F (37.2 C)  TempSrc: Oral Axillary Oral Oral  SpO2: 98% 93% 96% 98%  Weight:      Height:       General: Pt is alert, awake, not in acute distress Cardiovascular: RRR, S1/S2 +, no rubs, no gallops Respiratory: CTA bilaterally, no wheezing, no  rhonchi Abdominal: Soft, NT, ND, bowel sounds + Extremities: no edema, no cyanosis   The results of significant diagnostics from this hospitalization (including imaging, microbiology, ancillary and laboratory) are listed below for reference.    Microbiology: Recent Results (from the past 240 hour(s))  Surgical pcr screen     Status: None   Collection Time: 08/29/16  6:40 PM  Result Value Ref Range Status   MRSA, PCR NEGATIVE NEGATIVE Final   Staphylococcus aureus NEGATIVE NEGATIVE Final    Comment:        The Xpert SA Assay (FDA approved for NASAL specimens in patients over 11 years of age), is one component of a comprehensive surveillance program.  Test performance has been validated by [facility] for patients greater than or equal to 25 year old. It is not intended to diagnose infection nor to guide or monitor treatment.      Labs: BNP (last 3 results) No results for input(s): BNP in the last 8760 hours. Basic Metabolic Panel:  Recent Labs Lab 08/28/16 2110 08/29/16 0618 08/30/16 0205 09/01/16 0438  NA 139 138 138 139  K 3.9 3.3* 3.7 4.2  CL 111 104 105 106  CO2 23 24 27 29   GLUCOSE 107* 102* 96 146*  BUN 13 8 5* 8  CREATININE 0.58 0.59 0.56 0.59  CALCIUM 8.8* 8.6* 8.4* 8.5*  MG  --   --  1.6*  --    Liver Function Tests:  Recent Labs Lab 08/29/16 0618  ALBUMIN 3.2*   No results for input(s): LIPASE, AMYLASE in the last 168 hours. No results for input(s): AMMONIA in the last 168 hours. CBC:  Recent Labs Lab 08/28/16 2110 08/29/16 0618 08/30/16 0205 08/31/16 0854 09/01/16 0438  WBC 13.4* 11.1* 8.3 8.1 13.8*  HGB 13.3 12.3 12.0 8.0* 7.4*  HCT 39.5 37.4 36.8 24.0* 22.5*  MCV 86.2 87.0 89.3 86.3 87.2  PLT 330 293 264 240 248   Cardiac Enzymes: No results for input(s): CKTOTAL, CKMB, CKMBINDEX, TROPONINI in the last 168 hours. BNP: Invalid input(s): POCBNP CBG: No results for input(s): GLUCAP in the last 168 hours. D-Dimer No results for  input(s): DDIMER in the last 72 hours. Hgb A1c No results for input(s): HGBA1C in the last 72 hours. Lipid Profile No results for input(s): CHOL, HDL, LDLCALC, TRIG, CHOLHDL, LDLDIRECT in the last 72 hours. Thyroid function studies No results for input(s): TSH, T4TOTAL, T3FREE, THYROIDAB in the last 72 hours.  Invalid input(s): FREET3 Anemia work  up No results for input(s): VITAMINB12, FOLATE, FERRITIN, TIBC, IRON, RETICCTPCT in the last 72 hours. Urinalysis No results found for: COLORURINE, APPEARANCEUR, LABSPEC, PHURINE, GLUCOSEU, HGBUR, BILIRUBINUR, KETONESUR, PROTEINUR, UROBILINOGEN, NITRITE, LEUKOCYTESUR Sepsis Labs Invalid input(s): PROCALCITONIN,  WBC,  LACTICIDVEN Microbiology Recent Results (from the past 240 hour(s))  Surgical pcr screen     Status: None   Collection Time: 08/29/16  6:40 PM  Result Value Ref Range Status   MRSA, PCR NEGATIVE NEGATIVE Final   Staphylococcus aureus NEGATIVE NEGATIVE Final    Comment:        The Xpert SA Assay (FDA approved for NASAL specimens in patients over 33 years of age), is one component of a comprehensive surveillance program.  Test performance has been validated by [facility] for patients greater than or equal to 58 year old. It is not intended to diagnose infection nor to guide or monitor treatment.      Time coordinating discharge: Over 30 minutes  SIGNED:   Clint Lipps, MD  [facility] 09/01/2016, 9:46 AM Pager   If 7PM-7AM, please contact night-coverage [email or web address] Password [email or web address]

## 2016-09-01 NOTE — Progress Notes (Signed)
Physical Therapy Treatment Patient Details Name: Lori Flores MRN: [id number] DOB: Nov 13, 1965 Today's Date: 09/01/2016    History of Present Illness pt is a 51 yo female with L hip fx from fall skateboarding. s/p 08/30/16 direct ant L THA     PT Comments    Pt admitted with above diagnosis. Pt still has pain in her L hip that causes her mobility to be slower and more cautious than baseline.  Pt is independent with bed mobility, modified independent with transfers and gait utilizing a Rw. Pt requires supervision with stairs. Pt has met her goals and does not require any additional PT at this time to safely discharge home with 24 hour supervision.      Follow Up Recommendations  No PT follow up;Supervision/Assistance - 24 hour (requests no HHPT as her niece is a PT and will stay with her)     Equipment Recommendations  Rolling walker with 5" wheels;3in1 (PT)    Recommendations for Other Services       Precautions / Restrictions Precautions Precautions: None Restrictions Weight Bearing Restrictions: Yes LLE Weight Bearing: Weight bearing as tolerated    Mobility  Bed Mobility Overal bed mobility: Modified Independent Bed Mobility: Supine to Sit           General bed mobility comments: pt utilizes handrails to pull to side.  Transfers Overall transfer level: Modified independent Equipment used: Rolling walker (2 wheeled) Transfers: Sit to/from Stand Sit to Stand: Modified independent (Device/Increase time)         General transfer comment: able to get up with self adjustment of RW to better location to get up, also able to transfer into and out of tub with 3N1 with OT guidance  Ambulation/Gait Ambulation/Gait assistance: Modified independent (Device/Increase time) Ambulation Distance (Feet): 200 Feet Assistive device: Rolling walker (2 wheeled) Gait Pattern/deviations: Decreased step length - right;Decreased stance time - right;Step-through pattern;Narrow base of  support Gait velocity: decreased Gait velocity interpretation: Below normal speed for age/gender General Gait Details: able to ambulate in straight line with modified independance requires minor vc for awareness of RW in turning in bathroom   Stairs Stairs: Yes   Stair Management: Two rails Number of Stairs: 2 General stair comments: vc for going up with L LE and down with R LE and how to utilize walker on 2 wide steps to enter house  Wheelchair Mobility    Modified Rankin (Stroke Patients Only)       Balance Overall balance assessment: Independent Sitting-balance support: Feet supported Sitting balance-Leahy Scale: Good     Standing balance support: Bilateral upper extremity supported;No upper extremity supported Standing balance-Leahy Scale: Fair Standing balance comment: RW with RE support but able to independently stand at sink and wash hands after toileting                    Cognition Arousal/Alertness: Awake/alert Behavior During Therapy: WFL for tasks assessed/performed Overall Cognitive Status: Within Functional Limits for tasks assessed                      Exercises      General Comments        Pertinent Vitals/Pain Pain Assessment: Faces Pain Score: 3  Faces Pain Scale: Hurts a little bit Pain Location: L hip Pain Descriptors / Indicators: Sore Pain Intervention(s): Monitored during session;Premedicated before session  VSS           PT Goals (current goals can now be found  in the care plan section) Acute Rehab PT Goals Patient Stated Goal: to go home  PT Goal Formulation: With patient Progress towards PT goals: Progressing toward goals    Frequency    7X/week      PT Plan Discharge plan needs to be updated    Co-evaluation PT/OT/SLP Co-Evaluation/Treatment: Yes Reason for Co-Treatment: To address functional/ADL transfers PT goals addressed during session: Mobility/safety with mobility;Proper use of DME OT goals  addressed during session: ADL's and self-care     End of Session Equipment Utilized During Treatment: Gait belt Activity Tolerance: Patient tolerated treatment well Patient left: in chair;with call bell/phone within reach;with family/visitor present Nurse Communication: Mobility status PT Visit Diagnosis: Other abnormalities of gait and mobility (R26.89);Pain Pain - Right/Left: Left Pain - part of body: Hip     Time: 7014-1030 PT Time Calculation (min) (ACUTE ONLY): 24 min  Charges:  $Gait Training: 8-22 mins                    G Codes:       Bailey Mech Fleet 09/01/2016, 10:50 AM  Dani Gobble. Migdalia Dk PT, DPT Acute Rehabilitation  [phone] Pager [phone]

## 2016-09-01 NOTE — Care Management Note (Signed)
Case Management Note  Patient Details  Name: Lori Flores MRN: [id number] Date of Birth: 1965-09-25  Subjective/Objective:   51 yr old female s/pleft total hip arthroplasty.                 Action/Plan: Case manager spoke with patient concerning Home health and DME needs. Choice was offered for Home health Agency. Referral was called to [name]. [name] states she will have family support at discharge.     Expected Discharge Date:  09/01/16               Expected Discharge Plan:  Home w Home Health Services  In-House Referral:  NA  Discharge planning Services  CM Consult  Post Acute Care Choice:  Durable Medical Equipment, Home Health Choice offered to:  Patient  DME Arranged:  3-N-1, Walker rolling DME Agency:  [facility]  [facility]  Status of Service:  Completed, signed off  If discussed at Long Length of Stay Meetings, dates discussed:    Additional Comments:  Durenda GuthrieBrady, Joss Mcdill Naomi, RN 09/01/2016, 11:13 AM

## 2016-09-01 NOTE — Progress Notes (Signed)
Physical Therapy Discharge Patient Details Name: JONAH NESTLE MRN: [id number] DOB: 22-Dec-1965 Today's Date: 09/01/2016 Time: 9122-5834 PT Time Calculation (min) (ACUTE ONLY): 24 min  Patient discharged from PT services secondary to goals met and no further PT needs identified.  Please see latest therapy progress note for current level of functioning and progress toward goals.    Progress and discharge plan discussed with patient and/or caregiver: Patient/Caregiver agrees with plan  GP     [place] 09/01/2016, 10:53 AM  Dani Gobble. Migdalia Dk PT, DPT Acute Rehabilitation  [phone] Pager [phone]

## 2016-09-01 NOTE — Progress Notes (Signed)
PATIENT ID: Lori Flores  MRN: [name]  DOB/AGE:  51-Mar-1967 / 51 y.o.  2 Days Post-Op Procedure(s) (LRB): ANTERIOR APPROACH HEMI HIP ARTHROPLASTY (Left)    PROGRESS NOTE Subjective: Patient is alert, oriented, no Nausea, no Vomiting, yes passing gas, . Taking PO well. Denies SOB, Chest or Calf Pain. Using Incentive Spirometer, PAS in place. Ambulate WBAT with pt walking 20 ft yesterday. Patient reports pain as  3/10  .    Objective: Vital signs in last 24 hours: Vitals:   08/31/16 1358 08/31/16 2225 09/01/16 0008 09/01/16 0402  BP: (!) 100/53 113/60 (!) 110/46 (!) 118/54  Pulse: 84 (!) 103 (!) 103 (!) 101  Resp: 15 16 16 18   Temp: 97.6 F (36.4 C) 99.6 F (37.6 C) 99.3 F (37.4 C) 98.9 F (37.2 C)  TempSrc: Oral Axillary Oral Oral  SpO2: 98% 93% 96% 98%  Weight:      Height:          Intake/Output from previous day: I/O last 3 completed shifts: In: 956 [P.O.:956] Out: -    Intake/Output this shift: No intake/output data recorded.   LABORATORY DATA:  Recent Labs  08/30/16 0205 08/31/16 0854 09/01/16 0438  WBC 8.3 8.1 13.8*  HGB 12.0 8.0* 7.4*  HCT 36.8 24.0* 22.5*  PLT 264 240 248  NA 138  --  139  K 3.7  --  4.2  CL 105  --  106  CO2 27  --  29  BUN 5*  --  8  CREATININE 0.56  --  0.59  GLUCOSE 96  --  146*  CALCIUM 8.4*  --  8.5*    Examination: Neurologically intact Neurovascular intact Sensation intact distally Intact pulses distally Dorsiflexion/Plantar flexion intact Incision: dressing C/D/I No cellulitis present Compartment soft} XR AP&Lat of hip shows well placed\fixed THA  Assessment:   2 Days Post-Op Procedure(s) (LRB): ANTERIOR APPROACH HEMI HIP ARTHROPLASTY (Left) ADDITIONAL DIAGNOSIS:  Expected Acute Blood Loss Anemia, adult ADHD  Plan: PT/OT WBAT, THA  Monitor for symptoms of anemia.  Plan is to hold off on transfusion unless pt is symptomatic.  DVT Prophylaxis: SCDx72 hrs, ASA 325 mg BID x 2 weeks  DISCHARGE PLAN: Home,  when pt passes therapy goals  DISCHARGE NEEDS: HHPT, Walker and 3-in-1 comode seat

## 2016-12-06 NOTE — Addendum Note (Signed)
Addendum  created 12/06/16 1145 by Aleiya Rye, MD   Sign clinical note    

## 2019-01-14 IMAGING — CT CT HIP*L* W/O CM
4 series · 12 of 46 positions shown, 17 images · non-contrast
Comparison: None.

CLINICAL DATA: Pt brought from home via EMS for fall off
skateboard. Pt states she was riding and 'did a split and felt
something pop in left hip'. No obvious deformity in L hip, limited
ROM & severe pain w/movement, full sensation & strength L foot,
+2 pulses.

EXAM:
CT OF THE LEFT HIP WITHOUT CONTRAST
TECHNIQUE: Multidetector CT imaging of the left hip was performed according to
the standard protocol. Multiplanar CT image reconstructions were
also generated.

[Series 4: hip st · axial · 0.44mm/px · z∈[-390,-280]mm · 6 of 32 slices shown, 11 images]
[im 5/32  soft-tissue]
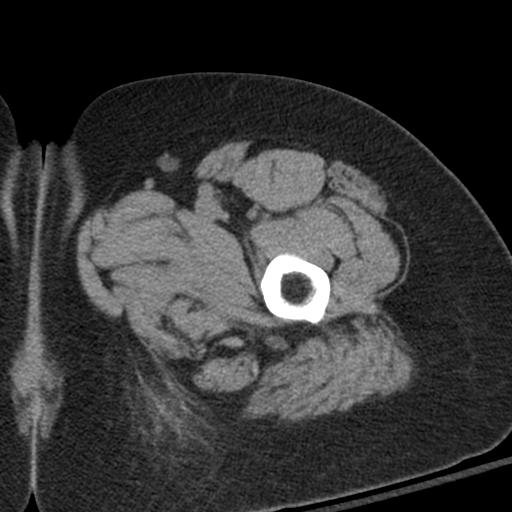
[im 5/32  bone]
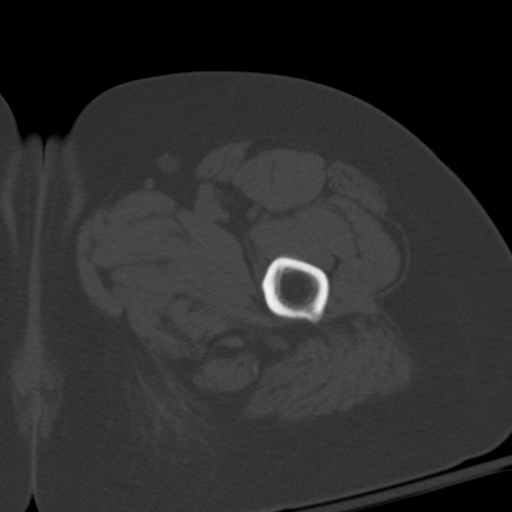
[im 9/32  soft-tissue]
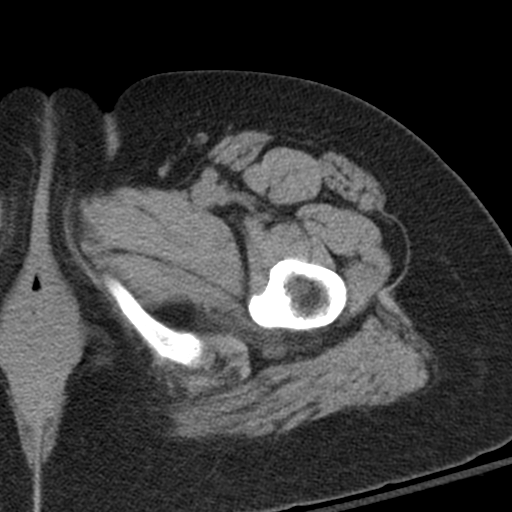
[im 14/32  soft-tissue]
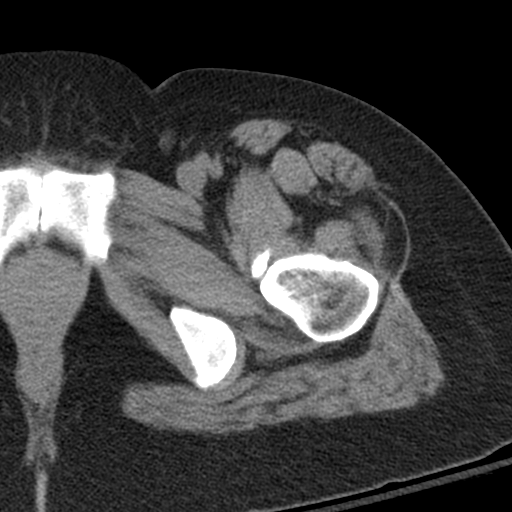
[im 14/32  lung]
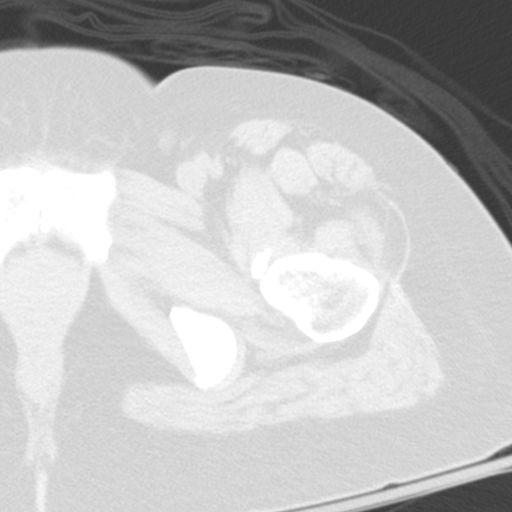
[im 18/32  soft-tissue]
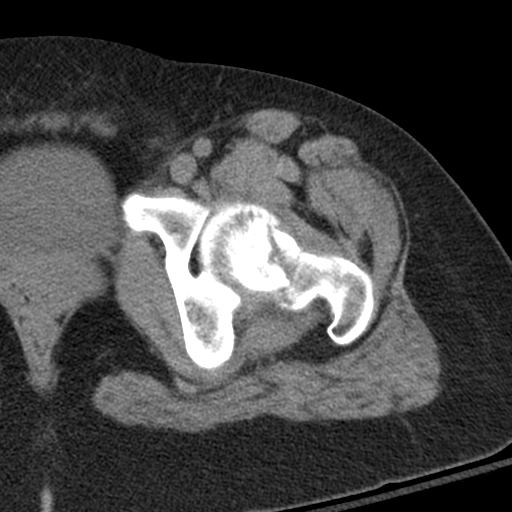
[im 18/32  lung]
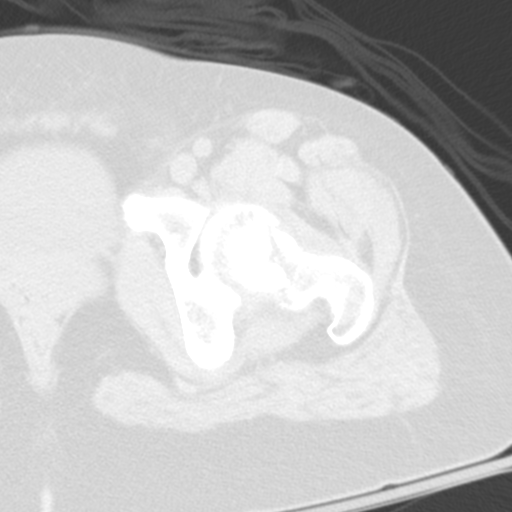
[im 23/32  soft-tissue]
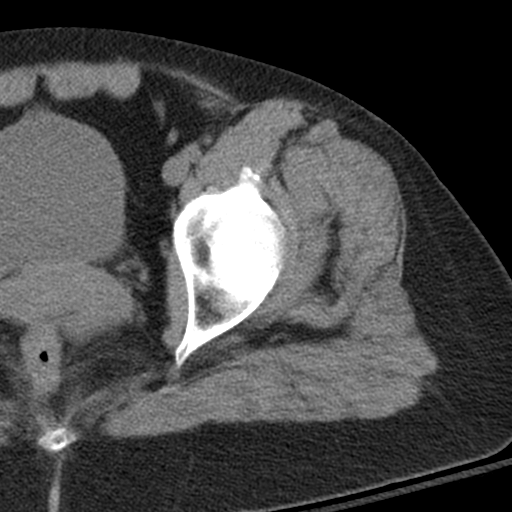
[im 23/32  lung]
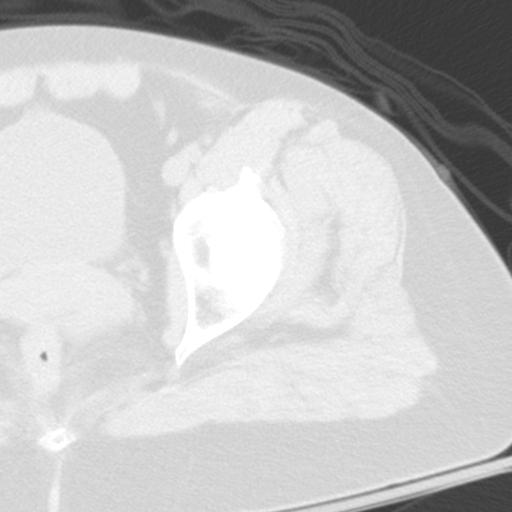
[im 27/32  soft-tissue]
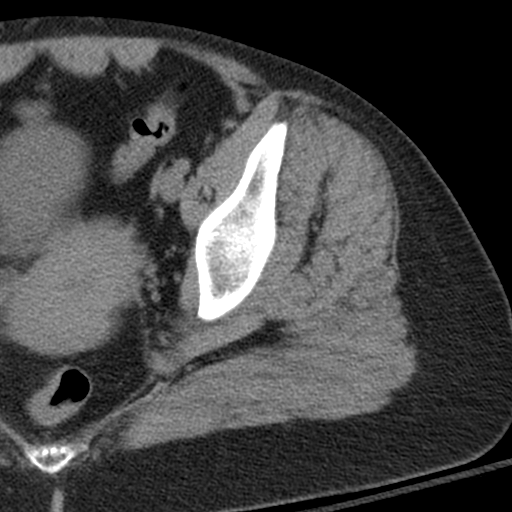
[im 27/32  lung]
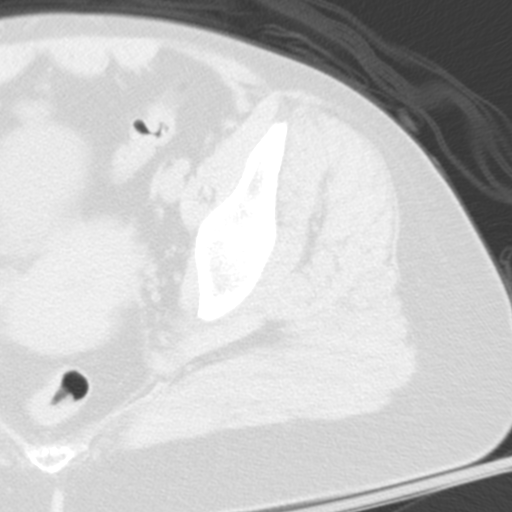

[Series 5: coronal bone · coronal · 0.35mm/px · 3 of 190 slices shown]
[im 64/190  soft-tissue]
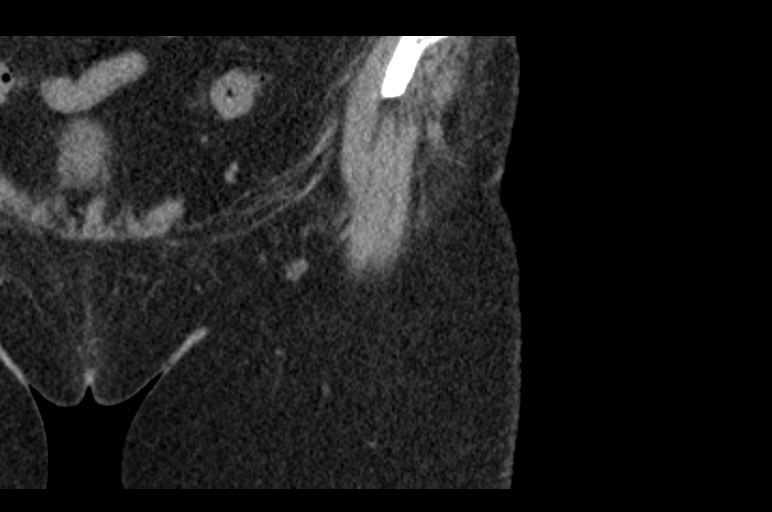
[im 85/190  soft-tissue]
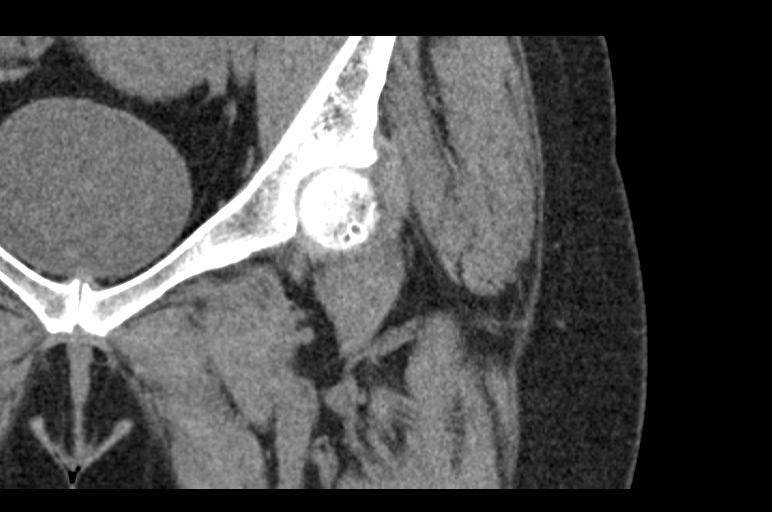
[im 106/190  soft-tissue]
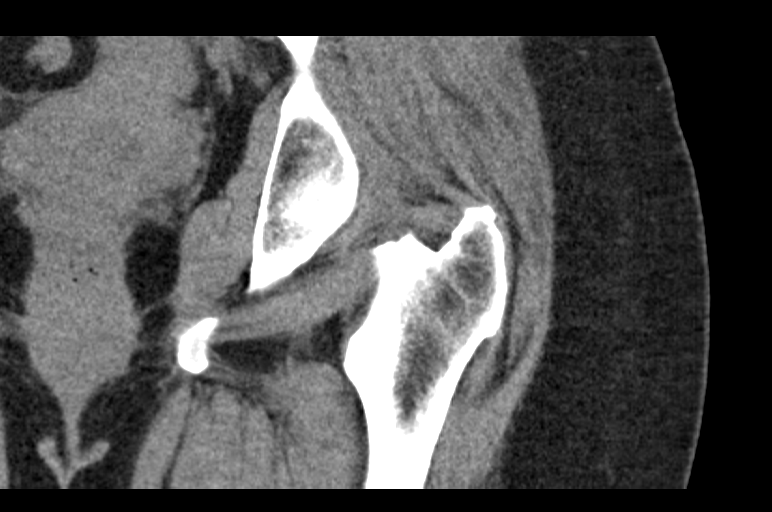

[Series 6: sagittal bone · sagittal · 0.36mm/px · 1 of 122 slices shown]
[im 41/122  soft-tissue]
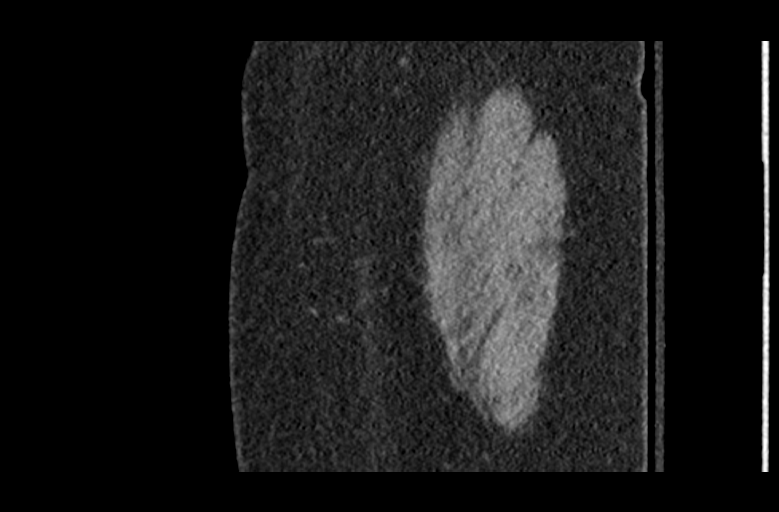

[Series 7: axial bone · axial · 0.42mm/px · z∈[-384,-361]mm · 2 of 93 slices shown]
[im 9/93  bone]
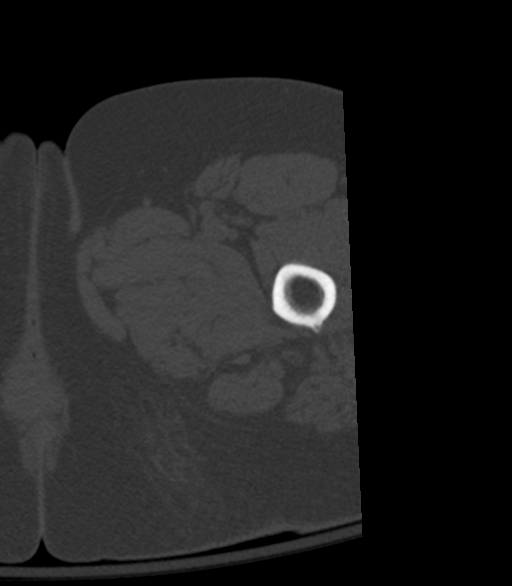
[im 21/93  bone]
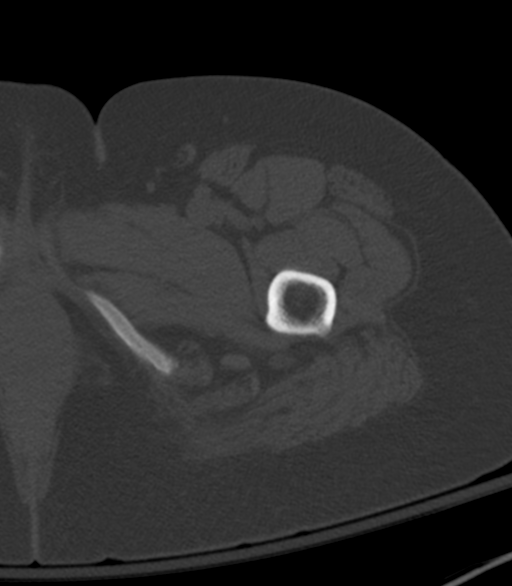

[12 of 46 positions shown; findings below may reference images not displayed]

FINDINGS: There is mildly comminuted and displaced fracture of the left
femoral neck. This extends from the superior femoral head neck
junction to the inferior mid cervical region. The fracture is mildly
displaced, by 9 mm, with the distal fracture component retracting
superiorly. No significant fracture angulation.

The left hip joint is normally spaced and aligned.

There are no other fractures.

Mild edema is seen in the directly adjacent left hip deep soft
tissues. There is a small hemarthrosis.

Visible structures within the pelvis are unremarkable. Surrounding
muscular structures are unremarkable. The tendons are intact.
IMPRESSION: 1. Mildly comminuted and displaced fracture of the left femoral neck
as described.

## 2019-01-14 IMAGING — DX DG CHEST 1V PORT
1 series · 1 of 1 positions shown · non-contrast
Comparison: None.

CLINICAL DATA: Fall off skateboard

EXAM:
PORTABLE CHEST 1 VIEW

[chest ap]
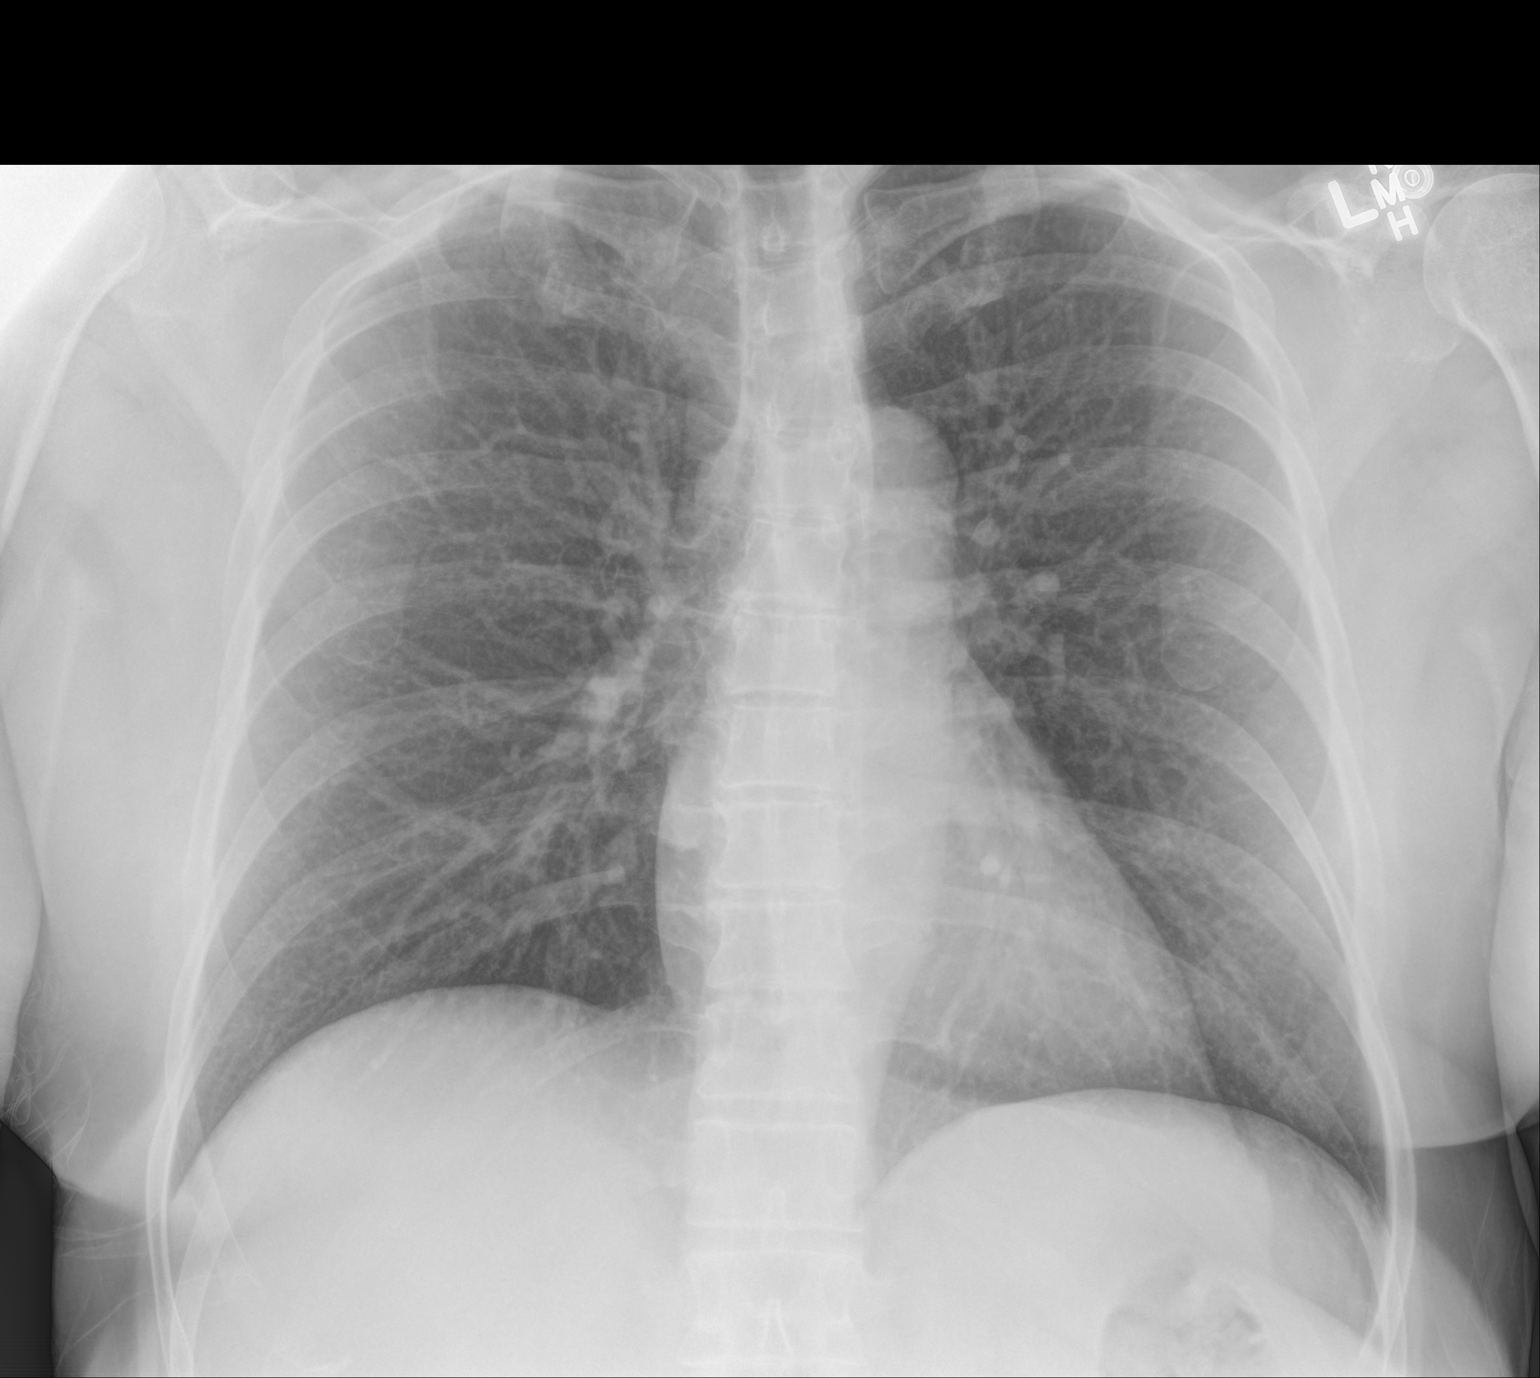

[1 of 1 positions shown; findings below may reference images not displayed]

FINDINGS: The heart size and mediastinal contours are within normal limits.
Both lungs are clear. The visualized skeletal structures are
unremarkable.
IMPRESSION: No active disease.

## 2019-01-16 IMAGING — RF DG C-ARM 61-120 MIN
1 series · 2 of 2 positions shown · non-contrast
Comparison: CT scan 08/28/2016

CLINICAL DATA: Left total hip arthroplasty.

EXAM:
DG C-ARM 61-120 MIN; OPERATIVE LEFT HIP WITH PELVIS

[Series 1: run · 2 of 2 slices shown]
[im 1/2]
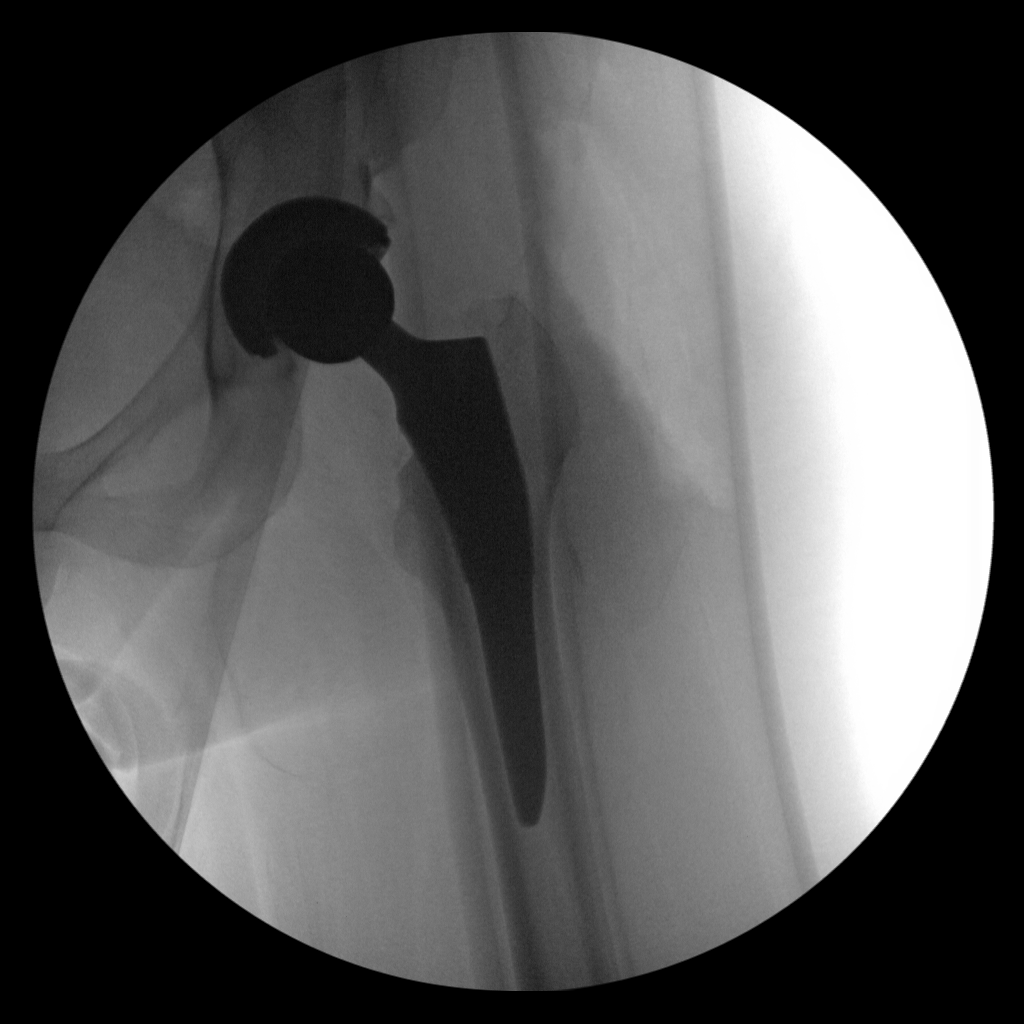
[im 2/2]
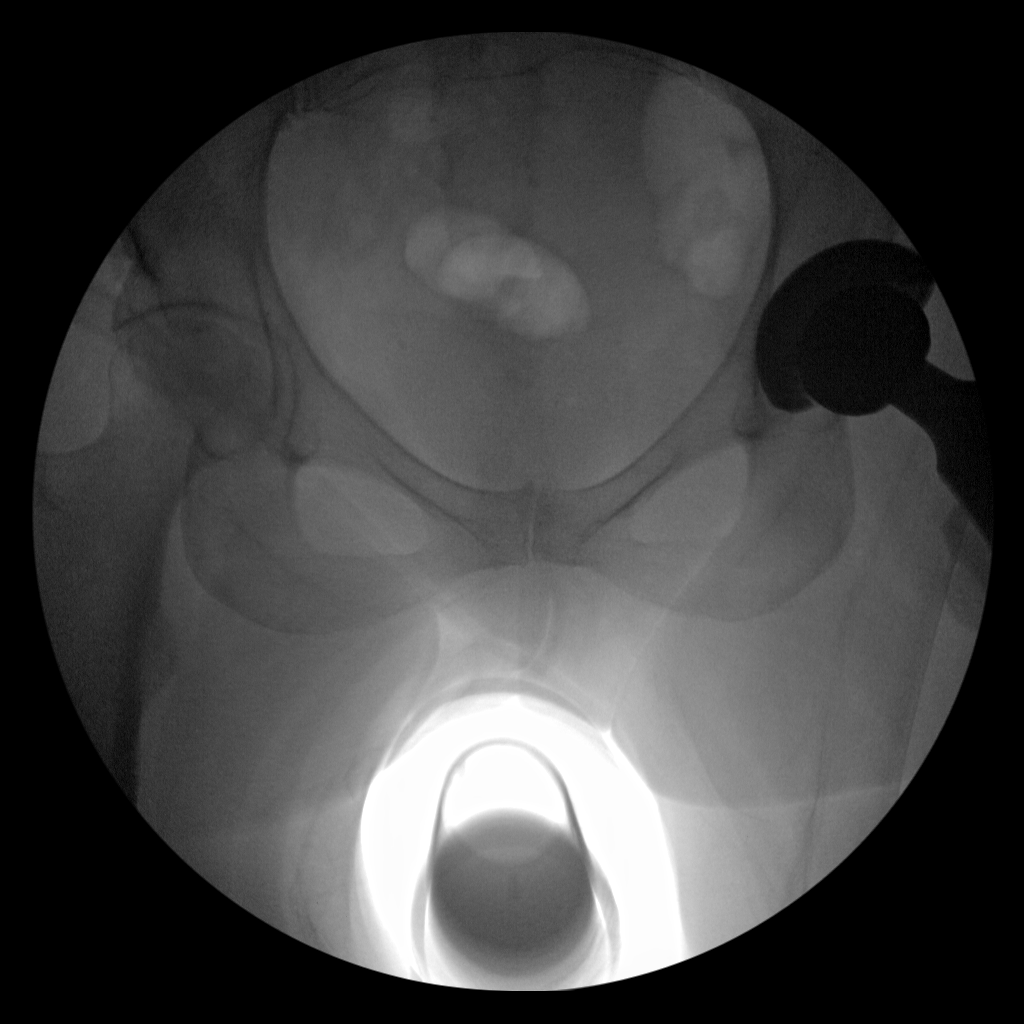

[2 of 2 positions shown; findings below may reference images not displayed]

FINDINGS: Femoral and acetabular components are well seated without
complicating features. The visualized bony pelvis is intact.
IMPRESSION: Well seated components of a total left hip arthroplasty.

## 2019-03-01 ENCOUNTER — Other Ambulatory Visit: Payer: Self-pay

## 2019-03-01 DIAGNOSIS — Z20822 Contact with and (suspected) exposure to covid-19: Secondary | ICD-10-CM

## 2019-03-03 LAB — NOVEL CORONAVIRUS, NAA: SARS-CoV-2, NAA: NOT DETECTED

## 2019-11-19 ENCOUNTER — Ambulatory Visit: Payer: Self-pay | Attending: Internal Medicine

## 2019-11-19 DIAGNOSIS — Z23 Encounter for immunization: Secondary | ICD-10-CM

## 2019-11-19 NOTE — Progress Notes (Signed)
   Covid-19 Vaccination Clinic  Name:  Lori Flores    MRN: [id number] DOB: [birth year]  11/19/2019  Mr. [name] was observed post Covid-19 immunization for 15 minutes without incident. He was provided with Vaccine Information Sheet and instruction to access the V-Safe system.   Mr. [name] was instructed to call 911 with any severe reactions post vaccine: [name] Kitchen Difficulty breathing  . Swelling of face and throat  . A fast heartbeat  . A bad rash all over body  . Dizziness and weakness   Immunizations Administered    Name Date Dose VIS Date Route   Pfizer COVID-19 Vaccine 11/19/2019 11:29 AM 0.3 mL 08/29/2018 Intramuscular   Manufacturer: [employer], [employer]   Lot: [id number]   NDC: [address]-7

## 2019-12-10 ENCOUNTER — Ambulatory Visit: Payer: Self-pay | Attending: Internal Medicine

## 2019-12-10 DIAGNOSIS — Z23 Encounter for immunization: Secondary | ICD-10-CM

## 2019-12-10 NOTE — Progress Notes (Signed)
   Covid-19 Vaccination Clinic  Name:  Lori Flores    MRN: [id number] DOB: 1965/10/21  12/10/2019  Mr. [name] was observed post Covid-19 immunization for 15 minutes without incident. He was provided with Vaccine Information Sheet and instruction to access the V-Safe system.   Mr. [name] was instructed to call 911 with any severe reactions post vaccine: [name] Kitchen Difficulty breathing  . Swelling of face and throat  . A fast heartbeat  . A bad rash all over body  . Dizziness and weakness   Immunizations Administered    Name Date Dose VIS Date Route   Pfizer COVID-19 Vaccine 12/10/2019 12:51 PM 0.3 mL 08/29/2018 Intramuscular   Manufacturer: [employer], [employer]   Lot: [id number]   NDC: [address]-7

## 2020-06-20 ENCOUNTER — Ambulatory Visit: Payer: Self-pay | Attending: Internal Medicine

## 2020-06-20 DIAGNOSIS — Z23 Encounter for immunization: Secondary | ICD-10-CM

## 2020-06-20 NOTE — Progress Notes (Signed)
   Covid-19 Vaccination Clinic  Name:  Lori Flores    MRN: [id number] DOB: January 30, 1966  06/20/2020  Lori Flores was observed post Covid-19 immunization for 15 minutes without incident. He was provided with Vaccine Information Sheet and instruction to access the V-Safe system.   Lori Flores was instructed to call 911 with any severe reactions post vaccine: [name] Kitchen Difficulty breathing  . Swelling of face and throat  . A fast heartbeat  . A bad rash all over body  . Dizziness and weakness   Immunizations Administered    Name Date Dose VIS Date Route   Pfizer COVID-19 Vaccine 06/20/2020  1:40 PM 0.3 mL 04/23/2020 Intramuscular   Manufacturer: [employer], [employer]   Lot: [id number]   NDC: [address]-3
